# Patient Record
Sex: Female | Born: 1955 | Race: White | Hispanic: No | Marital: Married | Smoking: Former smoker
Health system: Southern US, Community
[De-identification: ages and names within clinical notes are randomized; demographics above are authoritative.]

## PROBLEM LIST (undated history)

## (undated) DIAGNOSIS — M1990 Unspecified osteoarthritis, unspecified site: Secondary | ICD-10-CM

## (undated) DIAGNOSIS — T4145XA Adverse effect of unspecified anesthetic, initial encounter: Secondary | ICD-10-CM

## (undated) DIAGNOSIS — T8859XA Other complications of anesthesia, initial encounter: Secondary | ICD-10-CM

## (undated) DIAGNOSIS — E039 Hypothyroidism, unspecified: Secondary | ICD-10-CM

## (undated) HISTORY — PX: COLONOSCOPY: SHX174

## (undated) HISTORY — PX: APPENDECTOMY: SHX54

## (undated) HISTORY — PX: ABDOMINAL HYSTERECTOMY: SHX81

---

## 2005-07-07 ENCOUNTER — Ambulatory Visit: Payer: Self-pay | Admitting: Unknown Physician Specialty

## 2006-11-10 ENCOUNTER — Ambulatory Visit: Payer: Self-pay | Admitting: Unknown Physician Specialty

## 2009-07-16 ENCOUNTER — Ambulatory Visit: Payer: Self-pay | Admitting: Unknown Physician Specialty

## 2009-07-28 ENCOUNTER — Ambulatory Visit: Payer: Self-pay | Admitting: Unknown Physician Specialty

## 2010-01-08 ENCOUNTER — Ambulatory Visit: Payer: Self-pay | Admitting: General Surgery

## 2010-07-29 ENCOUNTER — Ambulatory Visit: Payer: Self-pay | Admitting: Unknown Physician Specialty

## 2011-09-13 ENCOUNTER — Emergency Department: Payer: Self-pay | Admitting: Emergency Medicine

## 2011-09-15 ENCOUNTER — Ambulatory Visit: Payer: Self-pay | Admitting: Unknown Physician Specialty

## 2012-10-03 ENCOUNTER — Ambulatory Visit: Payer: Self-pay | Admitting: General Surgery

## 2017-05-31 ENCOUNTER — Other Ambulatory Visit: Payer: Self-pay | Admitting: Obstetrics & Gynecology

## 2017-05-31 DIAGNOSIS — Z1231 Encounter for screening mammogram for malignant neoplasm of breast: Secondary | ICD-10-CM

## 2017-06-09 ENCOUNTER — Ambulatory Visit
Admission: RE | Admit: 2017-06-09 | Discharge: 2017-06-09 | Disposition: A | Discharge status: Home / Self Care | Payer: BC Managed Care – PPO | Source: Ambulatory Visit | Attending: Obstetrics & Gynecology | Admitting: Obstetrics & Gynecology

## 2017-06-09 ENCOUNTER — Encounter: Payer: Self-pay | Admitting: Radiology

## 2017-06-09 DIAGNOSIS — Z1231 Encounter for screening mammogram for malignant neoplasm of breast: Secondary | ICD-10-CM | POA: Diagnosis present

## 2017-06-15 ENCOUNTER — Other Ambulatory Visit: Payer: Self-pay | Admitting: *Deleted

## 2017-06-15 ENCOUNTER — Inpatient Hospital Stay
Admission: RE | Admit: 2017-06-15 | Discharge: 2017-06-15 | Disposition: A | Discharge status: Home / Self Care | Payer: Self-pay | Source: Ambulatory Visit | Attending: *Deleted | Admitting: *Deleted

## 2017-06-15 DIAGNOSIS — Z9289 Personal history of other medical treatment: Secondary | ICD-10-CM

## 2017-06-22 ENCOUNTER — Other Ambulatory Visit: Payer: Self-pay | Admitting: Obstetrics & Gynecology

## 2017-06-22 DIAGNOSIS — R928 Other abnormal and inconclusive findings on diagnostic imaging of breast: Secondary | ICD-10-CM

## 2017-06-24 ENCOUNTER — Ambulatory Visit
Admission: RE | Admit: 2017-06-24 | Discharge: 2017-06-24 | Disposition: A | Discharge status: Home / Self Care | Payer: BC Managed Care – PPO | Source: Ambulatory Visit | Attending: Obstetrics & Gynecology | Admitting: Obstetrics & Gynecology

## 2017-06-24 DIAGNOSIS — R928 Other abnormal and inconclusive findings on diagnostic imaging of breast: Secondary | ICD-10-CM | POA: Diagnosis not present

## 2017-06-24 DIAGNOSIS — N6489 Other specified disorders of breast: Secondary | ICD-10-CM | POA: Insufficient documentation

## 2017-10-18 ENCOUNTER — Other Ambulatory Visit: Payer: Self-pay

## 2017-10-18 ENCOUNTER — Encounter: Payer: Self-pay | Admitting: *Deleted

## 2017-10-26 ENCOUNTER — Ambulatory Visit: Payer: Worker's Compensation | Admitting: Anesthesiology

## 2017-10-26 ENCOUNTER — Ambulatory Visit
Admission: RE | Admit: 2017-10-26 | Discharge: 2017-10-26 | Disposition: A | Discharge status: Home / Self Care | Payer: Worker's Compensation | Source: Ambulatory Visit | Attending: Surgery | Admitting: Surgery

## 2017-10-26 ENCOUNTER — Encounter
Admission: RE | Disposition: A | Discharge status: Home / Self Care | Payer: Self-pay | Source: Ambulatory Visit | Attending: Surgery

## 2017-10-26 DIAGNOSIS — X58XXXA Exposure to other specified factors, initial encounter: Secondary | ICD-10-CM | POA: Diagnosis not present

## 2017-10-26 DIAGNOSIS — Z87891 Personal history of nicotine dependence: Secondary | ICD-10-CM | POA: Diagnosis not present

## 2017-10-26 DIAGNOSIS — S83231A Complex tear of medial meniscus, current injury, right knee, initial encounter: Secondary | ICD-10-CM | POA: Diagnosis present

## 2017-10-26 DIAGNOSIS — M1711 Unilateral primary osteoarthritis, right knee: Secondary | ICD-10-CM | POA: Insufficient documentation

## 2017-10-26 DIAGNOSIS — M94261 Chondromalacia, right knee: Secondary | ICD-10-CM | POA: Insufficient documentation

## 2017-10-26 HISTORY — DX: Hypothyroidism, unspecified: E03.9

## 2017-10-26 HISTORY — DX: Other complications of anesthesia, initial encounter: T88.59XA

## 2017-10-26 HISTORY — PX: KNEE ARTHROSCOPY: SHX127

## 2017-10-26 HISTORY — DX: Adverse effect of unspecified anesthetic, initial encounter: T41.45XA

## 2017-10-26 HISTORY — DX: Unspecified osteoarthritis, unspecified site: M19.90

## 2017-10-26 SURGERY — ARTHROSCOPY, KNEE
Anesthesia: General | Site: Knee | Laterality: Right | Wound class: Clean

## 2017-10-26 MED ORDER — HYDROCODONE-ACETAMINOPHEN 5-325 MG PO TABS: 1.0000 | ORAL_TABLET | Freq: Four times a day (QID) | ORAL | 0 refills | Status: DC | PRN

## 2017-10-26 MED ORDER — ONDANSETRON HCL 4 MG/2ML IJ SOLN: 4.0000 mg | Freq: Once | INTRAMUSCULAR | Status: DC | PRN

## 2017-10-26 MED ORDER — ONDANSETRON HCL 4 MG/2ML IJ SOLN: 4.0000 mg | Freq: Four times a day (QID) | INTRAMUSCULAR | Status: DC | PRN

## 2017-10-26 MED ORDER — OXYCODONE HCL 5 MG PO TABS: 5.0000 mg | ORAL_TABLET | Freq: Once | ORAL | Status: DC | PRN

## 2017-10-26 MED ORDER — ACETAMINOPHEN 325 MG PO TABS: 325.0000 mg | ORAL_TABLET | ORAL | Status: DC | PRN

## 2017-10-26 MED ORDER — HYDROCODONE-ACETAMINOPHEN 5-325 MG PO TABS: 1.0000 | ORAL_TABLET | ORAL | Status: DC | PRN

## 2017-10-26 MED ORDER — FENTANYL CITRATE (PF) 100 MCG/2ML IJ SOLN: 25.0000 ug | INTRAMUSCULAR | Status: DC | PRN

## 2017-10-26 MED ORDER — GLYCOPYRROLATE 0.2 MG/ML IJ SOLN
INTRAMUSCULAR | Status: DC | PRN
  Administered 2017-10-26: 0.1 mg via INTRAVENOUS

## 2017-10-26 MED ORDER — FENTANYL CITRATE (PF) 100 MCG/2ML IJ SOLN
INTRAMUSCULAR | Status: DC | PRN
  Administered 2017-10-26: 12.5 ug via INTRAVENOUS
  Administered 2017-10-26: 50 ug via INTRAVENOUS
  Administered 2017-10-26: 12.5 ug via INTRAVENOUS

## 2017-10-26 MED ORDER — PROPOFOL 10 MG/ML IV BOLUS
INTRAVENOUS | Status: DC | PRN
  Administered 2017-10-26: 140 mg via INTRAVENOUS

## 2017-10-26 MED ORDER — ONDANSETRON HCL 4 MG/2ML IJ SOLN
INTRAMUSCULAR | Status: DC | PRN
  Administered 2017-10-26: 4 mg via INTRAVENOUS

## 2017-10-26 MED ORDER — METOCLOPRAMIDE HCL 5 MG/ML IJ SOLN: 5.0000 mg | Freq: Three times a day (TID) | INTRAMUSCULAR | Status: DC | PRN

## 2017-10-26 MED ORDER — LACTATED RINGERS IV SOLN: 500.0000 mL | INTRAVENOUS | Status: DC

## 2017-10-26 MED ORDER — METOCLOPRAMIDE HCL 5 MG PO TABS: 5.0000 mg | ORAL_TABLET | Freq: Three times a day (TID) | ORAL | Status: DC | PRN

## 2017-10-26 MED ORDER — DEXAMETHASONE SODIUM PHOSPHATE 4 MG/ML IJ SOLN
INTRAMUSCULAR | Status: DC | PRN
  Administered 2017-10-26: 4 mg via INTRAVENOUS

## 2017-10-26 MED ORDER — LIDOCAINE HCL (CARDIAC) 20 MG/ML IV SOLN
INTRAVENOUS | Status: DC | PRN
  Administered 2017-10-26: 40 mg via INTRATRACHEAL

## 2017-10-26 MED ORDER — POTASSIUM CHLORIDE IN NACL 20-0.9 MEQ/L-% IV SOLN: INTRAVENOUS | Status: DC

## 2017-10-26 MED ORDER — LACTATED RINGERS IV SOLN
INTRAVENOUS | Status: DC
  Administered 2017-10-26: 11:00:00 via INTRAVENOUS

## 2017-10-26 MED ORDER — ONDANSETRON HCL 4 MG PO TABS: 4.0000 mg | ORAL_TABLET | Freq: Four times a day (QID) | ORAL | Status: DC | PRN

## 2017-10-26 MED ORDER — CEFAZOLIN SODIUM-DEXTROSE 2-4 GM/100ML-% IV SOLN
2.0000 g | Freq: Once | INTRAVENOUS | Status: AC
  Administered 2017-10-26: 2 g via INTRAVENOUS

## 2017-10-26 MED ORDER — MIDAZOLAM HCL 5 MG/5ML IJ SOLN
INTRAMUSCULAR | Status: DC | PRN
  Administered 2017-10-26: 2 mg via INTRAVENOUS

## 2017-10-26 MED ORDER — LIDOCAINE HCL (PF) 1 % IJ SOLN
INTRAMUSCULAR | Status: DC | PRN
  Administered 2017-10-26: 20 mL

## 2017-10-26 MED ORDER — LIDOCAINE-EPINEPHRINE 1 %-1:100000 IJ SOLN
INTRAMUSCULAR | Status: DC | PRN
  Administered 2017-10-26: 30 mL

## 2017-10-26 MED ORDER — OXYCODONE HCL 5 MG/5ML PO SOLN: 5.0000 mg | Freq: Once | ORAL | Status: DC | PRN

## 2017-10-26 MED ORDER — ACETAMINOPHEN 160 MG/5ML PO SOLN: 325.0000 mg | ORAL | Status: DC | PRN

## 2017-10-26 MED ORDER — BUPIVACAINE HCL (PF) 0.5 % IJ SOLN
INTRAMUSCULAR | Status: DC | PRN
  Administered 2017-10-26: 30 mL

## 2017-10-26 SURGICAL SUPPLY — 30 items
BANDAGE ELASTIC 6 LF NS (GAUZE/BANDAGES/DRESSINGS) ×3 IMPLANT
BLADE FULL RADIUS 3.5 (BLADE) ×3 IMPLANT
BUR ACROMIONIZER 4.0 (BURR) IMPLANT
CHLORAPREP W/TINT 26ML (MISCELLANEOUS) ×3 IMPLANT
COVER LIGHT HANDLE UNIVERSAL (MISCELLANEOUS) ×6 IMPLANT
CUFF TOURN SGL QUICK 30 (MISCELLANEOUS) ×2
CUFF TOURN SGL QUICK 34 (TOURNIQUET CUFF)
CUFF TRNQT CYL 34X4X40X1 (TOURNIQUET CUFF) IMPLANT
CUFF TRNQT CYL LO 30X4X (MISCELLANEOUS) ×1 IMPLANT
DRAPE IMP U-DRAPE 54X76 (DRAPES) ×3 IMPLANT
GAUZE SPONGE 4X4 12PLY STRL (GAUZE/BANDAGES/DRESSINGS) ×3 IMPLANT
GLOVE BIO SURGEON STRL SZ8 (GLOVE) ×9 IMPLANT
GLOVE INDICATOR 8.0 STRL GRN (GLOVE) ×6 IMPLANT
GOWN STRL REUS W/ TWL LRG LVL3 (GOWN DISPOSABLE) ×1 IMPLANT
GOWN STRL REUS W/ TWL XL LVL3 (GOWN DISPOSABLE) ×1 IMPLANT
GOWN STRL REUS W/TWL LRG LVL3 (GOWN DISPOSABLE) ×2
GOWN STRL REUS W/TWL XL LVL3 (GOWN DISPOSABLE) ×2
IV LACTATED RINGER IRRG 3000ML (IV SOLUTION) ×4
IV LR IRRIG 3000ML ARTHROMATIC (IV SOLUTION) ×2 IMPLANT
KIT RM TURNOVER STRD PROC AR (KITS) ×3 IMPLANT
MANIFOLD 4PT FOR NEPTUNE1 (MISCELLANEOUS) ×3 IMPLANT
NEEDLE HYPO 21X1.5 SAFETY (NEEDLE) ×6 IMPLANT
PACK ARTHROSCOPY KNEE (MISCELLANEOUS) ×3 IMPLANT
STRAP BODY AND KNEE 60X3 (MISCELLANEOUS) ×3 IMPLANT
SUT PROLENE 4 0 PS 2 18 (SUTURE) ×3 IMPLANT
SUT VIC AB 2-0 CT1 27 (SUTURE)
SUT VIC AB 2-0 CT1 TAPERPNT 27 (SUTURE) IMPLANT
SYR 50ML LL SCALE MARK (SYRINGE) ×3 IMPLANT
TUBING ARTHRO INFLOW-ONLY STRL (TUBING) ×3 IMPLANT
WAND HAND CNTRL MULTIVAC 90 (MISCELLANEOUS) IMPLANT

## 2017-10-26 NOTE — Anesthesia Preprocedure Evaluation (Signed)
Anesthesia Evaluation  Patient identified by MRN, date of birth, ID band Patient awake    Reviewed: Allergy & Precautions, H&P , NPO status , Patient's Chart, lab work & pertinent test results, reviewed documented beta blocker date and time   History of Anesthesia Complications (+) PONV and history of anesthetic complications  Airway Mallampati: II  TM Distance: >3 FB Neck ROM: full    Dental no notable dental hx.    Pulmonary neg pulmonary ROS, former smoker,    Pulmonary exam normal breath sounds clear to auscultation       Cardiovascular negative cardio ROS   Rhythm:regular Rate:Normal     Neuro/Psych negative neurological ROS  negative psych ROS   GI/Hepatic negative GI ROS, Neg liver ROS,   Endo/Other  Hypothyroidism   Renal/GU negative Renal ROS  negative genitourinary   Musculoskeletal   Abdominal   Peds  Hematology negative hematology ROS (+)   Anesthesia Other Findings   Reproductive/Obstetrics negative OB ROS                             Anesthesia Physical Anesthesia Plan  ASA: II  Anesthesia Plan: General   Post-op Pain Management:    Induction:   PONV Risk Score and Plan: 4 or greater and Ondansetron, Dexamethasone and Midazolam  Airway Management Planned:   Additional Equipment:   Intra-op Plan:   Post-operative Plan:   Informed Consent: I have reviewed the patients History and Physical, chart, labs and discussed the procedure including the risks, benefits and alternatives for the proposed anesthesia with the patient or authorized representative who has indicated his/her understanding and acceptance.     Plan Discussed with: CRNA  Anesthesia Plan Comments:         Anesthesia Quick Evaluation

## 2017-10-26 NOTE — Anesthesia Procedure Notes (Signed)
Procedure Name: LMA Insertion Date/Time: 10/26/2017 12:02 PM Performed by: Jimmy PicketAmyot, Leea Rambeau, CRNA Pre-anesthesia Checklist: Patient identified, Emergency Drugs available, Suction available, Timeout performed and Patient being monitored Patient Re-evaluated:Patient Re-evaluated prior to induction Oxygen Delivery Method: Circle system utilized Preoxygenation: Pre-oxygenation with 100% oxygen Induction Type: IV induction LMA: LMA inserted LMA Size: 4.0 Number of attempts: 1 Placement Confirmation: positive ETCO2 and breath sounds checked- equal and bilateral Tube secured with: Tape

## 2017-10-26 NOTE — Op Note (Signed)
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  Postoperative diagnosis:   Same.  Procedure:   Arthroscopic partial medial meniscectomy, arthroscopic abrasion chondroplasty with microfracturing of focal lateral femoral condylar lesion, arthroscopic abrasion chondroplasty of grade 2-3 chondromalacial changes of medial femoral condyle, lateral tibial plateau, and femoral trochlea, right knee.  Surgeon:   Maryagnes Amos, M.D.  Anesthesia:   General LMA.  Findings:   As above.  There was a complex degenerative tear involving the posteromedial portion of the medial meniscus extending posteriorly. Laterally, the meniscus was in satisfactory condition.  There were grade 2-3 chondromalacial changes diffusely involving the medial femoral condyle, grade 2 chondromalacial changes involving the medial tibial plateau, and focal grade 4 chondromalacial changes involving the weightbearing portion of the lateral femoral condyle measuring approximately 1.5 x 1.5 cm, as well as a smaller area involving the weightbearing portion of the lateral tibial plateau. There also were diffuse grade 3 chondromalacial changes involving the femoral trochlea, and grade 2 chondromalacial changes involving the patella.  Complications:   None.  EBL:   3 cc.  Total fluids:   800 cc of crystalloid.  Tourniquet time:   None  Drains:   None  Closure:   4-0 Prolene interrupted sutures.  Brief clinical note:   The patient is a 62 year old female with a several month history of medial sided right knee pain following a work-related injury. Subsequent workup, including an MRI scan, demonstrated the presence of a medial meniscus tear, as well as underlying degenerative joint disease. The patient presents at this time for arthroscopy, debridement, and partial medial meniscectomy.  Procedure:   The patient was brought into the  operating room and lain in the supine position. After adequate general laryngeal mask anesthesia was obtained, a timeout was performed to verify the appropriate side. The patient's right knee was injected sterilely using a solution of 30 cc of 1% lidocaine and 30 cc of 0.5% Sensorcaine with epinephrine. The right lower extremity was prepped with ChloraPrep solution before being draped sterilely. Preoperative antibiotics were administered. The expected portal sites were injected with 0.5% Sensorcaine with epinephrine before the camera was placed in the anterolateral portal and instrumentation performed through the anteromedial portal. The knee was sequentially examined beginning in the suprapatellar pouch, then progressing to the patellofemoral space, the medial gutter compartment, the notch, and finally the lateral compartment and gutter. The findings were as described above. Abundant reactive synovial tissues anteriorly were debrided using the full-radius resector in order to improve visualization. The complex tear of the medial meniscus was debrided back to stable margins using baskets and the full-radius resector. Subsequent probing of the remaining rim demonstrated excellent stability.  Areas of grade 2 and grade III chondromalacia involving the medial femoral condyle, the medial tibial plateau, and the femoral trochlea all were debrided back to stable margins using the full-radius resector.    Laterally, the lateral meniscus was probed and found to be intact. However, there was an area of full-thickness articular cartilage loss involving the weightbearing portion of the lateral femoral condyle which was debrided back to stable margins using the full-radius resector. Given that the area of subchondral bone measured approximately 1.5 x 1.5 cm, it was felt best to proceed with a microfracturing of this area. This was done with the appropriate arthroscopic awls. The inflow was temporarily turned off to verify  that appropriate bleeding from  the microfracture sites was present.  A small area of loose articular cartilage also was debrided from the lateral tibial plateau. The instruments were removed from the joint after suctioning the excess fluid. The portal sites were closed using 4-0 Prolene interrupted sutures before a sterile bulky dressing was applied to the knee. The patient was then awakened, extubated, and returned to the recovery room in satisfactory condition after tolerating the procedure well.

## 2017-10-26 NOTE — H&P (Signed)
Paper H&P to be scanned into permanent record. H&P reviewed and patient re-examined. No changes. 

## 2017-10-26 NOTE — Transfer of Care (Signed)
Immediate Anesthesia Transfer of Care Note  Patient: Molly Lawrence  Procedure(s) Performed: ARTHROSCOPY KNEE WITH DEBRIDEMENT AND PARTIAL MEDIAL MENISCECTOMY (Right Knee)  Patient Location: PACU  Anesthesia Type: General  Level of Consciousness: awake, alert  and patient cooperative  Airway and Oxygen Therapy: Patient Spontanous Breathing and Patient connected to supplemental oxygen  Post-op Assessment: Post-op Vital signs reviewed, Patient's Cardiovascular Status Stable, Respiratory Function Stable, Patent Airway and No signs of Nausea or vomiting  Post-op Vital Signs: Reviewed and stable  Complications: No apparent anesthesia complications

## 2017-10-26 NOTE — Discharge Instructions (Signed)
General Anesthesia, Adult, Care After °These instructions provide you with information about caring for yourself after your procedure. Your health care provider may also give you more specific instructions. Your treatment has been planned according to current medical practices, but problems sometimes occur. Call your health care provider if you have any problems or questions after your procedure. °What can I expect after the procedure? °After the procedure, it is common to have: °· Vomiting. °· A sore throat. °· Mental slowness. ° °It is common to feel: °· Nauseous. °· Cold or shivery. °· Sleepy. °· Tired. °· Sore or achy, even in parts of your body where you did not have surgery. ° °Follow these instructions at home: °For at least 24 hours after the procedure: °· Do not: °? Participate in activities where you could fall or become injured. °? Drive. °? Use heavy machinery. °? Drink alcohol. °? Take sleeping pills or medicines that cause drowsiness. °? Make important decisions or sign legal documents. °? Take care of children on your own. °· Rest. °Eating and drinking °· If you vomit, drink water, juice, or soup when you can drink without vomiting. °· Drink enough fluid to keep your urine clear or pale yellow. °· Make sure you have little or no nausea before eating solid foods. °· Follow the diet recommended by your health care provider. °General instructions °· Have a responsible adult stay with you until you are awake and alert. °· Return to your normal activities as told by your health care provider. Ask your health care provider what activities are safe for you. °· Take over-the-counter and prescription medicines only as told by your health care provider. °· If you smoke, do not smoke without supervision. °· Keep all follow-up visits as told by your health care provider. This is important. °Contact a health care provider if: °· You continue to have nausea or vomiting at home, and medicines are not helpful. °· You  cannot drink fluids or start eating again. °· You cannot urinate after 8-12 hours. °· You develop a skin rash. °· You have fever. °· You have increasing redness at the site of your procedure. °Get help right away if: °· You have difficulty breathing. °· You have chest pain. °· You have unexpected bleeding. °· You feel that you are having a life-threatening or urgent problem. °This information is not intended to replace advice given to you by your health care provider. Make sure you discuss any questions you have with your health care provider. °Document Released: 12/27/2000 Document Revised: 02/23/2016 Document Reviewed: 09/04/2015 °Elsevier Interactive Patient Education © 2018 [employer] ° ° °Keep dressing dry and intact.  °May shower after dressing changed on post-op day #4 (Sunday).  °Cover sutures with Band-Aids after drying off. °Apply ice frequently to knee. °Take ibuprofen 600-800 mg TID with meals for 7-10 days, then as necessary. °Take pain medication as prescribed or ES Tylenol when needed.  °May weight-bear as tolerated - use crutches or walker as needed. °Follow-up in 10-14 days or as scheduled. °

## 2017-10-26 NOTE — Anesthesia Postprocedure Evaluation (Signed)
Anesthesia Post Note  Patient: Molly Lawrence  Procedure(s) Performed: ARTHROSCOPY KNEE WITH DEBRIDEMENT AND PARTIAL MEDIAL MENISCECTOMY (Right Knee)  Patient location during evaluation: PACU Anesthesia Type: General Level of consciousness: awake and alert Pain management: pain level controlled Vital Signs Assessment: post-procedure vital signs reviewed and stable Respiratory status: spontaneous breathing, nonlabored ventilation and respiratory function stable Cardiovascular status: blood pressure returned to baseline and stable Postop Assessment: no apparent nausea or vomiting Anesthetic complications: no    Kieana Livesay D Monae Topping

## 2018-06-01 ENCOUNTER — Other Ambulatory Visit: Payer: Self-pay | Admitting: Obstetrics & Gynecology

## 2018-06-01 DIAGNOSIS — Z1231 Encounter for screening mammogram for malignant neoplasm of breast: Secondary | ICD-10-CM

## 2018-06-14 ENCOUNTER — Ambulatory Visit
Admission: RE | Admit: 2018-06-14 | Discharge: 2018-06-14 | Disposition: A | Discharge status: Home / Self Care | Payer: BC Managed Care – PPO | Source: Ambulatory Visit | Attending: Obstetrics & Gynecology | Admitting: Obstetrics & Gynecology

## 2018-06-14 DIAGNOSIS — Z1231 Encounter for screening mammogram for malignant neoplasm of breast: Secondary | ICD-10-CM | POA: Diagnosis present

## 2019-05-03 ENCOUNTER — Other Ambulatory Visit: Payer: Self-pay | Admitting: Internal Medicine

## 2019-05-03 DIAGNOSIS — Z1231 Encounter for screening mammogram for malignant neoplasm of breast: Secondary | ICD-10-CM

## 2019-06-05 ENCOUNTER — Ambulatory Visit: Payer: Self-pay | Admitting: Obstetrics and Gynecology

## 2019-06-07 ENCOUNTER — Other Ambulatory Visit: Payer: Self-pay

## 2019-06-07 DIAGNOSIS — Z20822 Contact with and (suspected) exposure to covid-19: Secondary | ICD-10-CM

## 2019-06-08 LAB — NOVEL CORONAVIRUS, NAA: SARS-CoV-2, NAA: NOT DETECTED

## 2019-06-18 ENCOUNTER — Ambulatory Visit
Admission: RE | Admit: 2019-06-18 | Discharge: 2019-06-18 | Disposition: A | Discharge status: Home / Self Care | Payer: BC Managed Care – PPO | Source: Ambulatory Visit | Attending: Internal Medicine | Admitting: Internal Medicine

## 2019-06-18 DIAGNOSIS — Z1231 Encounter for screening mammogram for malignant neoplasm of breast: Secondary | ICD-10-CM | POA: Diagnosis not present

## 2019-06-25 ENCOUNTER — Other Ambulatory Visit: Payer: Self-pay

## 2019-06-25 ENCOUNTER — Ambulatory Visit (INDEPENDENT_AMBULATORY_CARE_PROVIDER_SITE_OTHER): Payer: BC Managed Care – PPO | Admitting: Obstetrics and Gynecology

## 2019-06-25 ENCOUNTER — Encounter: Payer: Self-pay | Admitting: Obstetrics and Gynecology

## 2019-06-25 VITALS — BP 112/82 | HR 74 | Ht 69.0 in | Wt 200.0 lb

## 2019-06-25 DIAGNOSIS — Z01419 Encounter for gynecological examination (general) (routine) without abnormal findings: Secondary | ICD-10-CM | POA: Diagnosis not present

## 2019-06-25 DIAGNOSIS — Z1239 Encounter for other screening for malignant neoplasm of breast: Secondary | ICD-10-CM

## 2019-06-25 NOTE — Progress Notes (Signed)
Gynecology Annual Exam  PCP: Marguarite Arbour, MD  Chief Complaint:  Chief Complaint  Patient presents with  . Gynecologic Exam    History of Present Illness:Patient is a 63 y.o. G3P3 presents for annual exam. The patient has no complaints today.   LMP: No LMP recorded. Patient has had a hysterectomy. No postmenopausal bleeding  The patient is sexually active. She denies dyspareunia.  The patient does perform self breast exams.  There is no notable family history of breast or ovarian cancer in her family.  The patient wears seatbelts: yes.   The patient has regular exercise: not asked.    The patient denies current symptoms of depression.     Review of Systems: Review of Systems  Constitutional: Negative for chills and fever.  HENT: Negative for congestion.   Respiratory: Negative for cough and shortness of breath.   Cardiovascular: Negative for chest pain and palpitations.  Gastrointestinal: Negative for abdominal pain, constipation, diarrhea, heartburn, nausea and vomiting.  Genitourinary: Negative for dysuria, frequency and urgency.  Skin: Negative for itching and rash.  Neurological: Negative for dizziness and headaches.  Endo/Heme/Allergies: Negative for polydipsia.  Psychiatric/Behavioral: Negative for depression.    Past Medical History:  Past Medical History:  Diagnosis Date  . Arthritis    hands, knees, feet  . Complication of anesthesia    low BP after hysterectomy  . Hypothyroidism     Past Surgical History:  Past Surgical History:  Procedure Laterality Date  . ABDOMINAL HYSTERECTOMY    . APPENDECTOMY    . COLONOSCOPY    . KNEE ARTHROSCOPY Right 10/26/2017   Procedure: Arthroscopic partial medial meniscectomy, arthroscopic abrasion chondroplasty with microfracturing of focal lateral femoral condylar lesion, arthroscopic abrasion chondroplasty of grade 2-3 chondromalacial changes of medial femoral condyle, lateral tibial plateau, and femoral  trochlea, right knee.;  Surgeon: Christena Flake, MD;  Location: [facility];  Service: Orthopedics;  Later    Gynecologic History:  No LMP recorded. Patient has had a hysterectomy. Last Pap: Results were: N/A status post prior hysterectomy Last mammogram: 06/18/2019 Results were: BI-RAD I  Obstetric History: G3P3  Family History:  Family History  Problem Relation Age of Onset  . Breast cancer Neg Hx     Social History:  Social History   Socioeconomic History  . Marital status: Married    Spouse name: Not on file  . Number of children: Not on file  . Years of education: Not on file  . Highest education level: Not on file  Occupational History  . Not on file  Social Needs  . Financial resource strain: Not on file  . Food insecurity    Worry: Not on file    Inability: Not on file  . Transportation needs    Medical: Not on file    Non-medical: Not on file  Tobacco Use  . Smoking status: Former Smoker    Quit date: 1991    Years since quitting: 29.7  . Smokeless tobacco: Never Used  Substance and Sexual Activity  . Alcohol use: Yes    Alcohol/week: 5.0 standard drinks    Types: 5 Glasses of wine per week  . Drug use: Never  . Sexual activity: Yes    Birth control/protection: Surgical  Lifestyle  . Physical activity    Days per week: Not on file    Minutes per session: Not on file  . Stress: Not on file  Relationships  . Social connections  Talks on phone: Not on file    Gets together: Not on file    Attends religious service: Not on file    Active member of club or organization: Not on file    Attends meetings of clubs or organizations: Not on file    Relationship status: Not on file  . Intimate partner violence    Fear of current or ex partner: Not on file    Emotionally abused: Not on file    Physically abused: Not on file    Forced sexual activity: Not on file  Other Topics Concern  . Not on file  Social History Narrative  . Not on file     Allergies:  Allergies  Allergen Reactions  . Pineapple Swelling    tongue    Medications: Prior to Admission medications   Medication Sig Start Date End Date Taking? Authorizing Provider  diphenhydrAMINE (BENADRYL) 25 MG tablet Take 25 mg by mouth at bedtime as needed.   Yes [provider]  levothyroxine (SYNTHROID) 125 MCG tablet Take 125 mcg by mouth daily before breakfast.   Yes [provider]  Multiple Vitamin (MULTIVITAMIN) tablet Take 1 tablet by mouth daily.   Yes [provider]    Physical Exam Vitals: Blood pressure 112/82, pulse 74, height 5\' 9"  (1.753 m), weight 200 lb (90.7 kg).  General: NAD HEENT: normocephalic, anicteric Thyroid: no enlargement, no palpable nodules Pulmonary: No increased work of breathing, CTAB Cardiovascular: RRR, distal pulses 2+ Breast: Breast symmetrical, no tenderness, no palpable nodules or masses, no skin or nipple retraction present, no nipple discharge.  No axillary or supraclavicular lymphadenopathy. Abdomen: NABS, soft, non-tender, non-distended.  Umbilicus without lesions.  No hepatomegaly, splenomegaly or masses palpable. No evidence of hernia  Genitourinary:  External: Normal external female genitalia.  Normal urethral meatus, normal Bartholin's and Skene's glands.    Vagina: Normal vaginal mucosa, no evidence of prolapse.   Normal vaginal cuff  Cervix:  surgically absent  Uterus: surgically absent  Adnexa: ovaries non-enlarged, no adnexal masses  Rectal: deferred  Lymphatic: no evidence of inguinal lymphadenopathy Extremities: no edema, erythema, or tenderness Neurologic: Grossly intact Psychiatric: mood appropriate, affect full  Female chaperone present for pelvic and breast  portions of the physical exam     Assessment: 63 y.o. G3P3 routine annual exam  Plan: Problem List Items Addressed This Visit    None    Visit Diagnoses    Encounter for gynecological examination without abnormal  finding    -  Primary   Breast screening          1) Mammogram - recommend yearly screening mammogram.  Mammogram Is up to date  2) STI screening  was notoffered and therefore not obtained  3) ASCCP guidelines and rational discussed.  Patient opts for discontinue secondary to prior hysterectomy screening interval  4) Osteoporosis  - per USPTF routine screening DEXA at age 62 - Baseline DEXA 07/29/2010 normal - mother pelvic fracture  Consider FDA-approved medical therapies in postmenopausal women and men aged 62 years and older, based on the following: a) A hip or vertebral (clinical or morphometric) fracture b) T-score ? -2.5 at the femoral neck or spine after appropriate evaluation to exclude secondary causes C) Low bone mass (T-score between -1.0 and -2.5 at the femoral neck or spine) and a 10-year probability of a hip fracture ? 3% or a 10-year probability of a major osteoporosis-related fracture ? 20% based on the [place]-adapted WHO algorithm   5) Routine  healthcare maintenance including cholesterol, diabetes screening discussed managed by PCP  6) Colonoscopy - next due at 66  7) Return in about 1 year (around 06/24/2020) for annual.    Malachy Mood, MD Mosetta Pigeon, [facility] 06/25/2019, 10:41 AM

## 2019-09-03 ENCOUNTER — Other Ambulatory Visit: Payer: Self-pay

## 2019-09-03 DIAGNOSIS — Z20822 Contact with and (suspected) exposure to covid-19: Secondary | ICD-10-CM

## 2019-09-05 LAB — NOVEL CORONAVIRUS, NAA: SARS-CoV-2, NAA: NOT DETECTED

## 2019-09-20 ENCOUNTER — Emergency Department
Admission: EM | Admit: 2019-09-20 | Discharge: 2019-09-21 | Disposition: A | Discharge status: Home / Self Care | Payer: BC Managed Care – PPO | Attending: Emergency Medicine | Admitting: Emergency Medicine

## 2019-09-20 ENCOUNTER — Other Ambulatory Visit: Payer: Self-pay

## 2019-09-20 DIAGNOSIS — E039 Hypothyroidism, unspecified: Secondary | ICD-10-CM | POA: Diagnosis not present

## 2019-09-20 DIAGNOSIS — R600 Localized edema: Secondary | ICD-10-CM | POA: Diagnosis present

## 2019-09-20 DIAGNOSIS — K047 Periapical abscess without sinus: Secondary | ICD-10-CM

## 2019-09-20 DIAGNOSIS — L03211 Cellulitis of face: Secondary | ICD-10-CM | POA: Insufficient documentation

## 2019-09-20 DIAGNOSIS — Z87891 Personal history of nicotine dependence: Secondary | ICD-10-CM | POA: Diagnosis not present

## 2019-09-20 LAB — CBC
HCT: 36.2 % (ref 36.0–46.0)
Hemoglobin: 12.7 g/dL (ref 12.0–15.0)
MCH: 29.7 pg (ref 26.0–34.0)
MCHC: 35.1 g/dL (ref 30.0–36.0)
MCV: 84.8 fL (ref 80.0–100.0)
Platelets: 200 10*3/uL (ref 150–400)
RBC: 4.27 MIL/uL (ref 3.87–5.11)
RDW: 12.3 % (ref 11.5–15.5)
WBC: 9.1 10*3/uL (ref 4.0–10.5)
nRBC: 0 % (ref 0.0–0.2)

## 2019-09-20 MED ORDER — AMPICILLIN-SULBACTAM SODIUM 3 (2-1) G IJ SOLR
3.0000 g | Freq: Once | INTRAMUSCULAR | Status: AC
  Administered 2019-09-21: 3 g via INTRAVENOUS
  Filled 2019-09-20: qty 8

## 2019-09-20 NOTE — ED Triage Notes (Signed)
Pt to the er for right sided facial swelling following a root canal on Monday. Pt currently taking amoxicillin. Swelling did not start until Tuesday night. Fever started tonight. Pt called dentist who advised IV antibiotics.

## 2019-09-20 NOTE — ED Provider Notes (Signed)
[facility] Emergency Department Provider Note ______________________   First MD Initiated Contact with Patient 09/20/19 2326     (approximate)  I have reviewed the triage vital signs and the nursing notes.   HISTORY  Chief Complaint Facial Swelling   HPI Molly Lawrence is a 63 y.o. female with low list of previous medical conditions including recent mandibular root canal performed Monday presents to the emergency department secondary to right facial swelling tenderness.  Patient also admits to fever which started tonight.  Patient states that she is currently taking amoxicillin and has been doing so since her root canal.  Patient states that she spoke with her orthodontist who advised her to come to the emergency department and have IV antibiotics and follow-up with him in clinic tomorrow.  Patient denies any difficulty breathing or swallowing.       Past Medical History:  Diagnosis Date  . Arthritis    hands, knees, feet  . Complication of anesthesia    low BP after hysterectomy  . Hypothyroidism     There are no problems to display for this patient.   Past Surgical History:  Procedure Laterality Date  . ABDOMINAL HYSTERECTOMY    . APPENDECTOMY    . COLONOSCOPY    . KNEE ARTHROSCOPY Right 10/26/2017   Procedure: Arthroscopic partial medial meniscectomy, arthroscopic abrasion chondroplasty with microfracturing of focal lateral femoral condylar lesion, arthroscopic abrasion chondroplasty of grade 2-3 chondromalacial changes of medial femoral condyle, lateral tibial plateau, and femoral trochlea, right knee.;  Surgeon: Christena Flake, MD;  Location: [facility];  Service: Orthopedics;  Later    Prior to Admission medications   Medication Sig Start Date End Date Taking? Authorizing Provider  amoxicillin (AMOXIL) 500 MG capsule Take 500 mg by mouth 3 (three) times daily. 09/19/19  Yes [provider]  diphenhydrAMINE (BENADRYL) 25  MG tablet Take 25 mg by mouth at bedtime as needed.   Yes [provider]  levothyroxine (SYNTHROID) 125 MCG tablet Take 125 mcg by mouth daily before breakfast.   Yes [provider]  Multiple Vitamin (MULTIVITAMIN) tablet Take 1 tablet by mouth daily.   Yes [provider]  omeprazole (PRILOSEC) 40 MG capsule Take 40 mg by mouth daily. 08/06/19  Yes [provider]  clindamycin (CLEOCIN) 300 MG capsule Take 1 capsule (300 mg total) by mouth 3 (three) times daily for 10 days. 09/21/19 10/01/19  Darci Current, MD    Allergies Pineapple  Family History  Problem Relation Age of Onset  . Breast cancer Neg Hx     Social History Social History   Tobacco Use  . Smoking status: Former Smoker    Quit date: 1991    Years since quitting: 29.9  . Smokeless tobacco: Never Used  Substance Use Topics  . Alcohol use: Yes    Alcohol/week: 5.0 standard drinks    Types: 5 Glasses of wine per week  . Drug use: Never    Review of Systems Constitutional: No fever/chills Eyes: No visual changes. ENT: No sore throat.  Positive for right jaw swelling Cardiovascular: Denies chest pain. Respiratory: Denies shortness of breath. Gastrointestinal: No abdominal pain.  No nausea, no vomiting.  No diarrhea.  No constipation. Genitourinary: Negative for dysuria. Musculoskeletal: Negative for neck pain.  Negative for back pain. Integumentary: Negative for rash. Neurological: Negative for headaches, focal weakness or numbness.  ____________________________________________   PHYSICAL EXAM:  VITAL SIGNS: ED Triage Vitals  Enc Vitals Group  BP 09/20/19 2036 (!) 165/63     Pulse Rate 09/20/19 2036 95     Resp 09/20/19 2036 18     Temp 09/20/19 2036 99.2 F (37.3 C)     Temp Source 09/20/19 2036 Oral     SpO2 09/20/19 2036 97 %     Weight 09/20/19 2037 90.7 kg (200 lb)     Height 09/20/19 2037 1.753 m (5\' 9" )     Head Circumference --      Peak Flow --        Pain Score 09/20/19 2036 6     Pain Loc --      Pain Edu? --      Excl. in GC? --     Constitutional: Alert and oriented.  Eyes: Conjunctivae are normal.  Head: Right facial swelling along the anterior portion of the right side of the face along the mandible Mouth/Throat: No gross abnormality noted within the mouth Neck: No stridor.  No meningeal signs.   Cardiovascular: Normal rate, regular rhythm. Good peripheral circulation. Grossly normal heart sounds. Respiratory: Normal respiratory effort.  No retractions. Gastrointestinal: Soft and nontender. No distention.   Musculoskeletal: No lower extremity tenderness nor edema. No gross deformities of extremities. Neurologic:  Normal speech and language. No gross focal neurologic deficits are appreciated.  Skin: Right facial swelling without any overlying erythema Psychiatric: Mood and affect are normal. Speech and behavior are normal.  ____________________________________________   LABS (all labs ordered are listed, but only abnormal results are displayed)  Labs Reviewed  BASIC METABOLIC PANEL - Abnormal; Notable for the following components:      Result Value   Glucose, Bld 117 (*)    All other components within normal limits  CBC   ______________________________________________  RADIOLOGY I, Radar [employer] N Sherrod Toothman, personally viewed and evaluated these images (plain radiographs) as part of my medical decision making, as well as reviewing the written report by the radiologist.  ED MD interpretation: Periapical lucency/abscess of the right mandibular second bicuspid with sequelae of prior root canal likely source of infection.  Subtle 9 mm rim-enhancing collection adjacent to the right mandibular body consistent with a small odontogenic abscess associated inflammatory stranding and swelling within the adjacent right side of face consistent with regional cellulitis per radiologist on CT maxillofacial interpretation.  Official  radiology report(s): CT Maxillofacial W Contrast  Result Date: 09/21/2019 CLINICAL DATA:  Initial evaluation for acute right-sided facial swelling status post root canal. EXAM: CT MAXILLOFACIAL WITH CONTRAST TECHNIQUE: Multidetector CT imaging of the maxillofacial structures was performed with intravenous contrast. Multiplanar CT image reconstructions were also generated. CONTRAST:  75mL OMNIPAQUE IOHEXOL 300 MG/ML  SOLN COMPARISON:  None. FINDINGS: Osseous: No acute osseous abnormality about the face. No discrete osseous lesion. Orbits: Globes and orbital soft tissues within normal limits. Patient status post bilateral ocular lens replacement. Sinuses: Trace layering opacity noted within the right sphenoid sinus. Paranasal sinuses are otherwise clear. Mastoid air cells and middle ear cavities are well pneumatized and free of fluid. Soft tissues: Soft tissue swelling with inflammatory stranding seen involving the right face, primarily involving the right masticator space adjacent to the right jaw. Periapical lucency/abscess seen at the right mandibular second bicuspid with sequelae of prior root canal, likely source of infection. Subtle 9 mm rim enhancing collection adjacent to the right mandibular body suspicious for an odontogenic abscess (series 2, image 57), somewhat difficult to visualize given streak artifact from adjacent dental amalgam. No other discrete abscess or drainable fluid collection.  No extension of infection into the deeper spaces of the face or neck. Limited intracranial: Unremarkable. IMPRESSION: Periapical lucency/abscess at the right mandibular second bicuspid with sequelae of prior root canal, likely source of infection. Subtle 9 mm rim enhancing collection adjacent to the right mandibular body consistent with a small odontogenic abscess. Associated inflammatory stranding and swelling within the adjacent right face consistent with regional cellulitis. Electronically Signed   By: Jeannine Boga M.D.   On: 09/21/2019 01:49      Procedures   ____________________________________________   INITIAL IMPRESSION / MDM / ASSESSMENT AND PLAN / ED COURSE  As part of my medical decision making, I reviewed the following data within the electronic MEDICAL RECORD NUMBER   63 year old female presented with above-stated history and physical exam secondary to right facial swelling following root canal with associated fever at home tonight.  Concern for possible right facial cellulitis and odontogenic abscess.  As such CT maxillofacial performed which did reveal a small odontogenic abscess.  Patient was given 3 g of Unasyn in the emergency department.  Patient will be prescribed clindamycin for home with recommendation to follow-up with recommendation to follow-up with orthodontist today as planned.     ____________________________________________  FINAL CLINICAL IMPRESSION(S) / ED DIAGNOSES  Final diagnoses:  Dental abscess  Facial cellulitis     MEDICATIONS GIVEN DURING THIS VISIT:  Medications  Ampicillin-Sulbactam (UNASYN) 3 g in sodium chloride 0.9 % 100 mL IVPB (0 g Intravenous Stopped 09/21/19 0109)  iohexol (OMNIPAQUE) 300 MG/ML solution 75 mL (75 mLs Intravenous Contrast Given 09/21/19 0036)     ED Discharge Orders         Ordered    clindamycin (CLEOCIN) 300 MG capsule  3 times daily     09/21/19 0211          *Please note:  Raford Pitcher was evaluated in Emergency Department on 09/21/2019 for the symptoms described in the history of present illness. She was evaluated in the context of the global COVID-19 pandemic, which necessitated consideration that the patient might be at risk for infection with the SARS-CoV-2 virus that causes COVID-19. Institutional protocols and algorithms that pertain to the evaluation of patients at risk for COVID-19 are in a state of rapid change based on information released by regulatory bodies including the CDC and federal and  state organizations. These policies and algorithms were followed during the patient's care in the ED.  Some ED evaluations and interventions may be delayed as a result of limited staffing during the pandemic.*  Note:  This document was prepared using Dragon voice recognition software and may include unintentional dictation errors.   Gregor Hams, MD 09/21/19 [phone]

## 2019-09-21 ENCOUNTER — Emergency Department: Payer: BC Managed Care – PPO

## 2019-09-21 LAB — BASIC METABOLIC PANEL
Anion gap: 10 (ref 5–15)
BUN: 12 mg/dL (ref 8–23)
CO2: 25 mmol/L (ref 22–32)
Calcium: 9 mg/dL (ref 8.9–10.3)
Chloride: 103 mmol/L (ref 98–111)
Creatinine, Ser: 0.68 mg/dL (ref 0.44–1.00)
GFR calc Af Amer: >60 mL/min (ref >60)
GFR calc non Af Amer: >60 mL/min (ref >60)
Glucose, Bld: 117 mg/dL — ABNORMAL HIGH (ref 70–99)
Potassium: 3.8 mmol/L (ref 3.5–5.1)
Sodium: 138 mmol/L (ref 135–145)

## 2019-09-21 MED ORDER — IOHEXOL 300 MG/ML  SOLN
75.0000 mL | Freq: Once | INTRAMUSCULAR | Status: AC | PRN
  Administered 2019-09-21: 01:00:00 75 mL via INTRAVENOUS

## 2019-09-21 MED ORDER — CLINDAMYCIN HCL 300 MG PO CAPS: 300.0000 mg | ORAL_CAPSULE | Freq: Three times a day (TID) | ORAL | 0 refills | Status: AC

## 2019-11-25 ENCOUNTER — Emergency Department: Payer: BC Managed Care – PPO

## 2019-11-25 ENCOUNTER — Encounter: Payer: Self-pay | Admitting: Emergency Medicine

## 2019-11-25 ENCOUNTER — Other Ambulatory Visit: Payer: Self-pay

## 2019-11-25 ENCOUNTER — Emergency Department
Admission: EM | Admit: 2019-11-25 | Discharge: 2019-11-25 | Disposition: A | Discharge status: Home / Self Care | Payer: BC Managed Care – PPO | Attending: Emergency Medicine | Admitting: Emergency Medicine

## 2019-11-25 DIAGNOSIS — S6991XA Unspecified injury of right wrist, hand and finger(s), initial encounter: Secondary | ICD-10-CM | POA: Diagnosis present

## 2019-11-25 DIAGNOSIS — Y92099 Unspecified place in other non-institutional residence as the place of occurrence of the external cause: Secondary | ICD-10-CM | POA: Insufficient documentation

## 2019-11-25 DIAGNOSIS — Y9389 Activity, other specified: Secondary | ICD-10-CM | POA: Diagnosis not present

## 2019-11-25 DIAGNOSIS — W108XXA Fall (on) (from) other stairs and steps, initial encounter: Secondary | ICD-10-CM | POA: Insufficient documentation

## 2019-11-25 DIAGNOSIS — M79631 Pain in right forearm: Secondary | ICD-10-CM | POA: Insufficient documentation

## 2019-11-25 DIAGNOSIS — Y999 Unspecified external cause status: Secondary | ICD-10-CM | POA: Diagnosis not present

## 2019-11-25 DIAGNOSIS — S4991XA Unspecified injury of right shoulder and upper arm, initial encounter: Secondary | ICD-10-CM

## 2019-11-25 DIAGNOSIS — W19XXXA Unspecified fall, initial encounter: Secondary | ICD-10-CM

## 2019-11-25 MED ORDER — IBUPROFEN 600 MG PO TABS: 600.0000 mg | ORAL_TABLET | Freq: Four times a day (QID) | ORAL | 0 refills | Status: AC | PRN

## 2019-11-25 NOTE — ED Provider Notes (Signed)
[facility] Emergency Department Provider Note  ____________________________________________  Time seen: Approximately 2:17 PM  I have reviewed the triage vital signs and the nursing notes.   HISTORY  Chief Complaint Fall    HPI Molly Lawrence is a 64 y.o. female that presents to the emergency department for evaluation of right forearm pain following a fall today.  Patient tripped going up 2 stairs on her porch.  She states that the steps are like a "stump".  There are 2 steps going up and then 2 steps going back down.  She tripped going up to 2 steps and then fell down the other 2 steps.  She did not hit her head or lose consciousness.  She used her hands to brace her fall.  She landed on both of her knees.  She is not having any pain to her left arm or both knees.  She is having pain to her mid to distal forearm.  She is able to move her arm but with some minor pain.  No numbness, tingling.   Past Medical History:  Diagnosis Date  . Arthritis    hands, knees, feet  . Complication of anesthesia    low BP after hysterectomy  . Hypothyroidism     There are no problems to display for this patient.   Past Surgical History:  Procedure Laterality Date  . ABDOMINAL HYSTERECTOMY    . APPENDECTOMY    . COLONOSCOPY    . KNEE ARTHROSCOPY Right 10/26/2017   Procedure: Arthroscopic partial medial meniscectomy, arthroscopic abrasion chondroplasty with microfracturing of focal lateral femoral condylar lesion, arthroscopic abrasion chondroplasty of grade 2-3 chondromalacial changes of medial femoral condyle, lateral tibial plateau, and femoral trochlea, right knee.;  Surgeon: Christena Flake, MD;  Location: [facility];  Service: Orthopedics;  Later    Prior to Admission medications   Medication Sig Start Date End Date Taking? Authorizing Provider  amoxicillin (AMOXIL) 500 MG capsule Take 500 mg by mouth 3 (three) times daily. 09/19/19   [provider]  diphenhydrAMINE (BENADRYL) 25 MG tablet Take 25 mg by mouth at bedtime as needed.    [provider]  ibuprofen (ADVIL) 600 MG tablet Take 1 tablet (600 mg total) by mouth every 6 (six) hours as needed. 11/25/19   Enid Derry, PA-C  levothyroxine (SYNTHROID) 125 MCG tablet Take 125 mcg by mouth daily before breakfast.    [provider]  Multiple Vitamin (MULTIVITAMIN) tablet Take 1 tablet by mouth daily.    [provider]  omeprazole (PRILOSEC) 40 MG capsule Take 40 mg by mouth daily. 08/06/19   [provider]    Allergies Pineapple  Family History  Problem Relation Age of Onset  . Breast cancer Neg Hx     Social History Social History   Tobacco Use  . Smoking status: Former Smoker    Quit date: 1991    Years since quitting: 30.1  . Smokeless tobacco: Never Used  Substance Use Topics  . Alcohol use: Yes    Alcohol/week: 5.0 standard drinks    Types: 5 Glasses of wine per week  . Drug use: Never     Review of Systems  Constitutional: No fever/chills Respiratory: No SOB. Gastrointestinal: No nausea, no vomiting.  Musculoskeletal: Positive for forearm pain. Skin: Negative for rash, abrasions, lacerations, ecchymosis. Neurological: Negative for numbness or tingling   ____________________________________________   PHYSICAL EXAM:  VITAL SIGNS: ED Triage Vitals  Enc Vitals Group  BP 11/25/19 1342 128/64     Pulse Rate 11/25/19 1342 66     Resp 11/25/19 1342 16     Temp 11/25/19 1343 97.6 F (36.4 C)     Temp Source 11/25/19 1343 Oral     SpO2 11/25/19 1342 99 %     Weight 11/25/19 1341 200 lb (90.7 kg)     Height 11/25/19 1341 5\' 9"  (1.753 m)     Head Circumference --      Peak Flow --      Pain Score 11/25/19 1340 5     Pain Loc --      Pain Edu? --      Excl. in GC? --      Constitutional: Alert and oriented. Well appearing and in no acute distress. Eyes: Conjunctivae are normal. PERRL. EOMI. Head:  Atraumatic. ENT:      Ears:      Nose: No congestion/rhinnorhea.      Mouth/Throat: Mucous membranes are moist.  Neck: No stridor.  No cervical spine tenderness to palpation. Cardiovascular: Normal rate, regular rhythm.  Good peripheral circulation.  Symmetric radial pulses bilaterally. Respiratory: Normal respiratory effort without tachypnea or retractions. Lungs CTAB. Good air entry to the bases with no decreased or absent breath sounds. Gastrointestinal: Bowel sounds 4 quadrants. Soft and nontender to palpation. No guarding or rigidity. No palpable masses. No distention. Musculoskeletal: Full range of motion to all extremities. No gross deformities appreciated.  Mild tenderness to palpation to distal to mid forearm.  Full range of motion, including supination and pronation of wrist with minimal pain. Neurologic:  Normal speech and language. No gross focal neurologic deficits are appreciated.  Skin:  Skin is warm, dry and intact. No rash noted. Psychiatric: Mood and affect are normal. Speech and behavior are normal. Patient exhibits appropriate insight and judgement.   ____________________________________________   LABS (all labs ordered are listed, but only abnormal results are displayed)  Labs Reviewed - No data to display ____________________________________________  EKG   ____________________________________________  RADIOLOGY 11/27/19, personally viewed and evaluated these images (plain radiographs) as part of my medical decision making, as well as reviewing the written report by the radiologist.  DG Wrist Complete Right  Result Date: 11/25/2019 CLINICAL DATA:  Fall with wrist and forearm pain. EXAM: RIGHT WRIST - COMPLETE 3+ VIEW COMPARISON:  None. FINDINGS: There is no evidence of fracture or dislocation. There is no evidence of arthropathy or other focal bone abnormality. Soft tissues are unremarkable. IMPRESSION: Negative. Electronically Signed   By: 11/27/2019  M.D.   On: 11/25/2019 14:45    ____________________________________________    PROCEDURES  Procedure(s) performed:    Procedures    Medications - No data to display   ____________________________________________   INITIAL IMPRESSION / ASSESSMENT AND PLAN / ED COURSE  Pertinent labs & imaging results that were available during my care of the patient were reviewed by me and considered in my medical decision making (see chart for details).  Review of the [place] CSRS was performed in accordance of the [facility] prior to dispensing any controlled drugs.   Patient presented to emergency department for evaluation after fall.  Vital signs and exam are reassuring.  No acute bony abnormalities on x-ray.  Arm sling was placed.  Patient will be discharged home with prescriptions for ibuprofen. Patient is to follow up with PCP as directed. Patient is given ED precautions to return to the ED for any worsening or new symptoms.  Raford Pitcher was evaluated in Emergency Department on 11/25/2019 for the symptoms described in the history of present illness. She was evaluated in the context of the global COVID-19 pandemic, which necessitated consideration that the patient might be at risk for infection with the SARS-CoV-2 virus that causes COVID-19. Institutional protocols and algorithms that pertain to the evaluation of patients at risk for COVID-19 are in a state of rapid change based on information released by regulatory bodies including the CDC and federal and state organizations. These policies and algorithms were followed during the patient's care in the ED.  ____________________________________________  FINAL CLINICAL IMPRESSION(S) / ED DIAGNOSES  Final diagnoses:  Fall, initial encounter  Injury of right upper extremity, initial encounter      NEW MEDICATIONS STARTED DURING THIS VISIT:  ED Discharge Orders         Ordered    ibuprofen (ADVIL) 600 MG tablet  Every 6 hours PRN      11/25/19 1504              This chart was dictated using voice recognition software/Dragon. Despite best efforts to proofread, errors can occur which can change the meaning. Any change was purely unintentional.    Laban Emperor, PA-C 11/25/19 1518    Vanessa [place], MD 11/29/19 [phone]

## 2019-11-25 NOTE — ED Notes (Signed)
Pt states she fell going up stairs outside and broke fall with right arm. Pt c/o right forearm pain. Pt denies dizziness, LOC, or other injury.  Pt aox4, ambulatory, nad noted, no bleeding or obvious deformity noted. Sling applied to right arm, Ice pack provided.

## 2019-11-25 NOTE — ED Triage Notes (Signed)
Pt to ED via POV for fall. Pt is having right arm pain. Pt is in NAD.

## 2020-07-04 ENCOUNTER — Other Ambulatory Visit: Payer: Self-pay

## 2020-07-04 ENCOUNTER — Encounter: Payer: Self-pay | Admitting: Obstetrics and Gynecology

## 2020-07-04 ENCOUNTER — Ambulatory Visit (INDEPENDENT_AMBULATORY_CARE_PROVIDER_SITE_OTHER): Payer: BC Managed Care – PPO | Admitting: Obstetrics and Gynecology

## 2020-07-04 VITALS — BP 122/74 | Ht 69.0 in | Wt 197.0 lb

## 2020-07-04 DIAGNOSIS — Z1239 Encounter for other screening for malignant neoplasm of breast: Secondary | ICD-10-CM

## 2020-07-04 DIAGNOSIS — Z01419 Encounter for gynecological examination (general) (routine) without abnormal findings: Secondary | ICD-10-CM

## 2020-07-04 NOTE — Progress Notes (Signed)
Gynecology Annual Exam  PCP: Marguarite Arbour, MD  Chief Complaint:  Chief Complaint  Patient presents with  . Gynecologic Exam    History of Present Illness:Patient is a 64 y.o. G3P3 presents for annual exam. The patient has no complaints today.   LMP: No LMP recorded. Patient has had a hysterectomy.  The patient is sexually active. She denies dyspareunia.  The patient does perform self breast exams.  There is no notable family history of breast or ovarian cancer in her family.  The patient wears seatbelts: yes.   The patient has regular exercise: not asked.    The patient denies current symptoms of depression.     Review of Systems: Review of Systems  Constitutional: Negative for chills and fever.  HENT: Negative for congestion.   Respiratory: Negative for cough and shortness of breath.   Cardiovascular: Negative for chest pain and palpitations.  Gastrointestinal: Negative for abdominal pain, constipation, diarrhea, heartburn, nausea and vomiting.  Genitourinary: Negative for dysuria, frequency and urgency.  Skin: Negative for itching and rash.  Neurological: Negative for dizziness and headaches.  Endo/Heme/Allergies: Negative for polydipsia.  Psychiatric/Behavioral: Negative for depression.    Past Medical History:  There are no problems to display for this patient.   Past Surgical History:  Past Surgical History:  Procedure Laterality Date  . ABDOMINAL HYSTERECTOMY    . APPENDECTOMY    . COLONOSCOPY    . KNEE ARTHROSCOPY Right 10/26/2017   Procedure: Arthroscopic partial medial meniscectomy, arthroscopic abrasion chondroplasty with microfracturing of focal lateral femoral condylar lesion, arthroscopic abrasion chondroplasty of grade 2-3 chondromalacial changes of medial femoral condyle, lateral tibial plateau, and femoral trochlea, right knee.;  Surgeon: Christena Flake, MD;  Location: [facility];  Service: Orthopedics;  Later    Gynecologic History:    No LMP recorded. Patient has had a hysterectomy. Last Pap: Results were: N/A s/p prior hysterectomy Last mammogram: 9/14/202020 Results were: BI-RAD I  Obstetric History: G3P3  Family History:  Family History  Problem Relation Age of Onset  . Breast cancer Neg Hx     Social History:  Social History   Socioeconomic History  . Marital status: Married    Spouse name: Not on file  . Number of children: Not on file  . Years of education: Not on file  . Highest education level: Not on file  Occupational History  . Not on file  Tobacco Use  . Smoking status: Former Smoker    Quit date: 1991    Years since quitting: 30.7  . Smokeless tobacco: Never Used  Vaping Use  . Vaping Use: Never used  Substance and Sexual Activity  . Alcohol use: Yes    Alcohol/week: 5.0 standard drinks    Types: 5 Glasses of wine per week  . Drug use: Never  . Sexual activity: Yes    Birth control/protection: Surgical  Other Topics Concern  . Not on file  Social History Narrative  . Not on file   Social Determinants of Health   Financial Resource Strain:   . Difficulty of Paying Living Expenses: Not on file  Food Insecurity:   . Worried About Programme researcher, broadcasting/film/video in the Last Year: Not on file  . Ran Out of Food in the Last Year: Not on file  Transportation Needs:   . Lack of Transportation (Medical): Not on file  . Lack of Transportation (Non-Medical): Not on file  Physical Activity:   . Days of Exercise  per Week: Not on file  . Minutes of Exercise per Session: Not on file  Stress:   . Feeling of Stress : Not on file  Social Connections:   . Frequency of Communication with Friends and Family: Not on file  . Frequency of Social Gatherings with Friends and Family: Not on file  . Attends Religious Services: Not on file  . Active Member of Clubs or Organizations: Not on file  . Attends Banker Meetings: Not on file  . Marital Status: Not on file  Intimate Partner Violence:    . Fear of Current or Ex-Partner: Not on file  . Emotionally Abused: Not on file  . Physically Abused: Not on file  . Sexually Abused: Not on file    Allergies:  Allergies  Allergen Reactions  . Pineapple Swelling    tongue    Medications: Prior to Admission medications   Medication Sig Start Date End Date Taking? Authorizing Provider  ibuprofen (ADVIL) 600 MG tablet Take 1 tablet (600 mg total) by mouth every 6 (six) hours as needed. 11/25/19  Yes Enid Derry, PA-C  levothyroxine (SYNTHROID) 125 MCG tablet Take 125 mcg by mouth daily before breakfast.   Yes [provider]  omeprazole (PRILOSEC) 40 MG capsule Take 40 mg by mouth daily. 08/06/19  Yes [provider]    Physical Exam Vitals: Blood pressure 122/74, height 5\' 9"  (1.753 m), weight 197 lb (89.4 kg).  General: NAD HEENT: normocephalic, anicteric Thyroid: no enlargement, no palpable nodules Pulmonary: No increased work of breathing, CTAB Cardiovascular: RRR, distal pulses 2+ Breast: Breast symmetrical, no tenderness, no palpable nodules or masses, no skin or nipple retraction present, no nipple discharge.  No axillary or supraclavicular lymphadenopathy. Abdomen: NABS, soft, non-tender, non-distended.  Umbilicus without lesions.  No hepatomegaly, splenomegaly or masses palpable. No evidence of hernia  Genitourinary:  External: Normal external female genitalia.  Normal urethral meatus, normal Bartholin's and Skene's glands.    Vagina: Normal vaginal mucosa, no evidence of prolapse.    Cervix: absent  Uterus: absent  Adnexa: ovaries non-enlarged, no adnexal masses  Rectal: deferred  Lymphatic: no evidence of inguinal lymphadenopathy Extremities: no edema, erythema, or tenderness Neurologic: Grossly intact Psychiatric: mood appropriate, affect full  Female chaperone present for pelvic and breast  portions of the physical exam  Immunization History  Administered Date(s) Administered  . PFIZER  SARS-COV-2 Vaccination 11/16/2019, 12/07/2019     Assessment: 64 y.o. G3P3 routine annual exam  Plan: Problem List Items Addressed This Visit    None    Visit Diagnoses    Encounter for gynecological examination without abnormal finding    -  Primary   Breast screening       Relevant Orders   MM 3D SCREEN BREAST BILATERAL      1) Mammogram - recommend yearly screening mammogram.  Mammogram Was ordered today  2) STI screening  was notoffered and therefore not obtained  3) ASCCP guidelines and rational discussed.  Patient opts for discontinue secondary to prior hysterectomy screening interval  4) Osteoporosis  - per USPTF routine screening DEXA at age 48  5) Routine healthcare maintenance including cholesterol, diabetes screening discussed managed by PCP  6) Colonoscopy - next at age 40  7) Return in about 1 year (around 07/04/2021) for annual.    09/03/2021, MD Vena Austria, [facility] 07/04/2020, 10:29 AM

## 2020-07-04 NOTE — Patient Instructions (Signed)
[facility] [address] [place] [address]  [facility] [place]  [address] [place] [address]  Phone: [phone]  

## 2020-08-11 ENCOUNTER — Ambulatory Visit
Admission: RE | Admit: 2020-08-11 | Discharge: 2020-08-11 | Disposition: A | Discharge status: Home / Self Care | Payer: BC Managed Care – PPO | Source: Ambulatory Visit | Attending: Obstetrics and Gynecology | Admitting: Obstetrics and Gynecology

## 2020-08-11 ENCOUNTER — Other Ambulatory Visit: Payer: Self-pay

## 2020-08-11 DIAGNOSIS — Z1231 Encounter for screening mammogram for malignant neoplasm of breast: Secondary | ICD-10-CM | POA: Diagnosis not present

## 2020-08-11 DIAGNOSIS — Z1239 Encounter for other screening for malignant neoplasm of breast: Secondary | ICD-10-CM

## 2020-12-21 IMAGING — MG DIGITAL SCREENING BILAT W/ TOMO W/ CAD
6 of 10 series · 6 of 30 positions shown · non-contrast
Comparison: Previous exam(s).

CLINICAL DATA: Screening.

EXAM:
DIGITAL SCREENING BILATERAL MAMMOGRAM WITH TOMO AND CAD

[L CC synth-2D (1 of 2)]
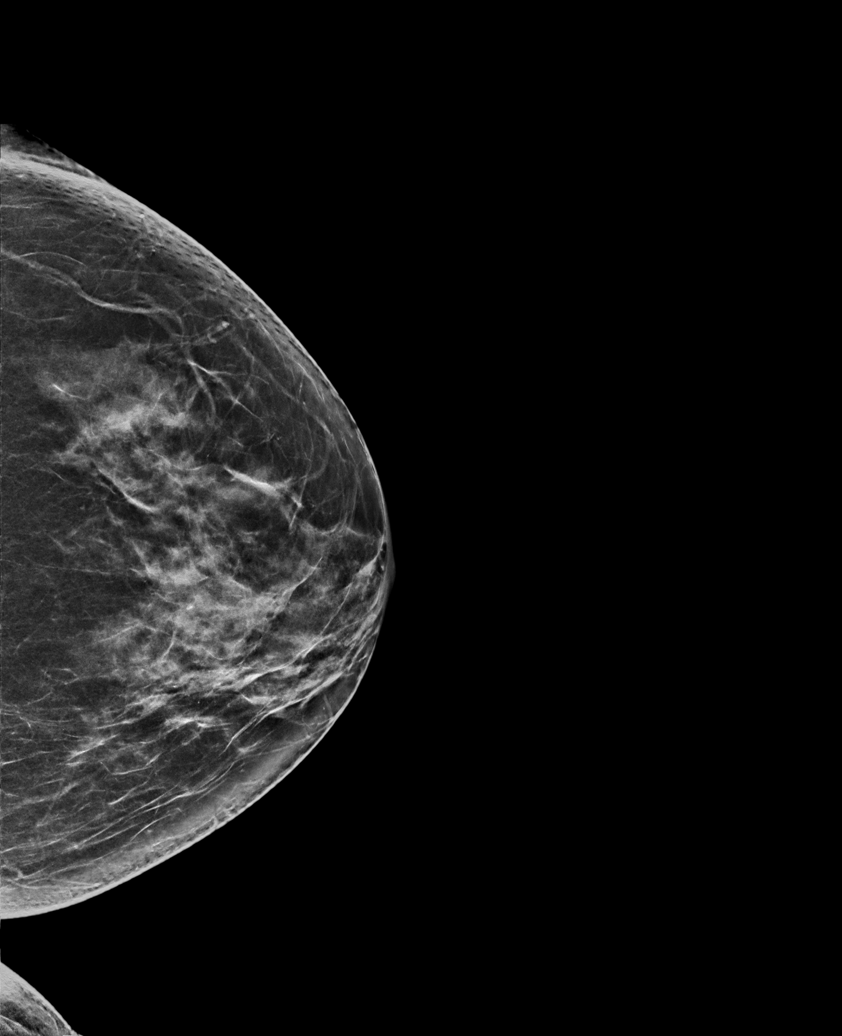

[R MLO synth-2D]
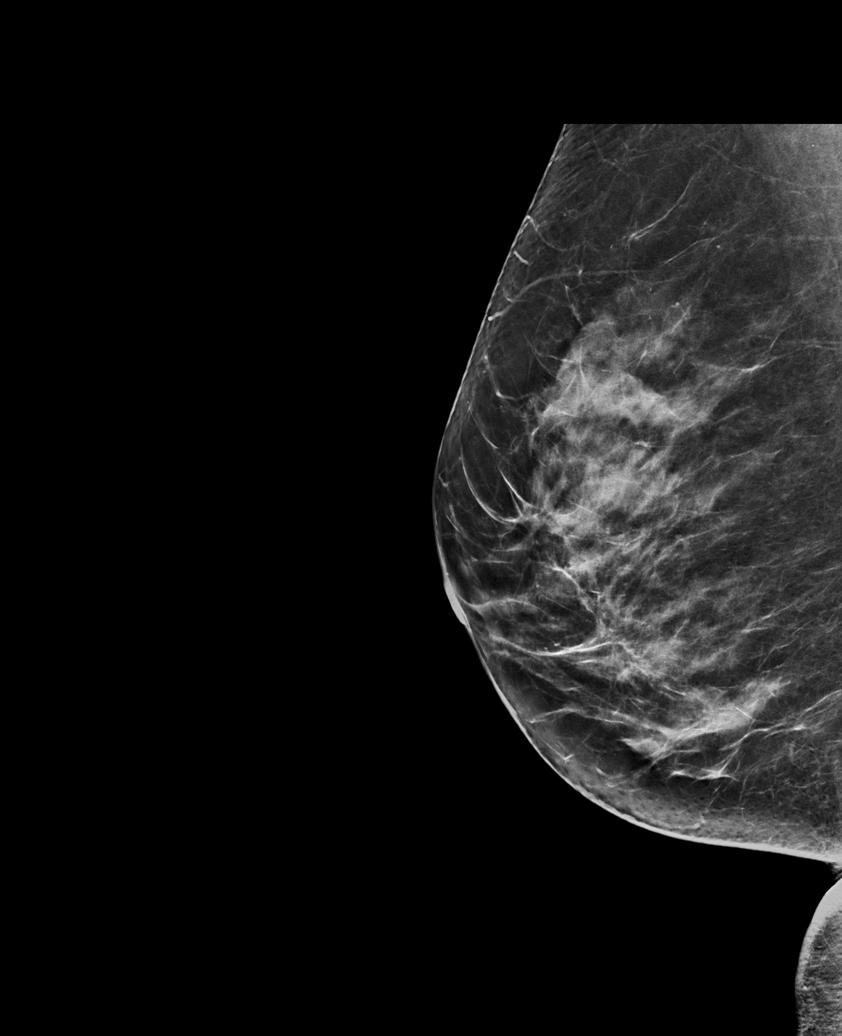

[R CC synth-2D]
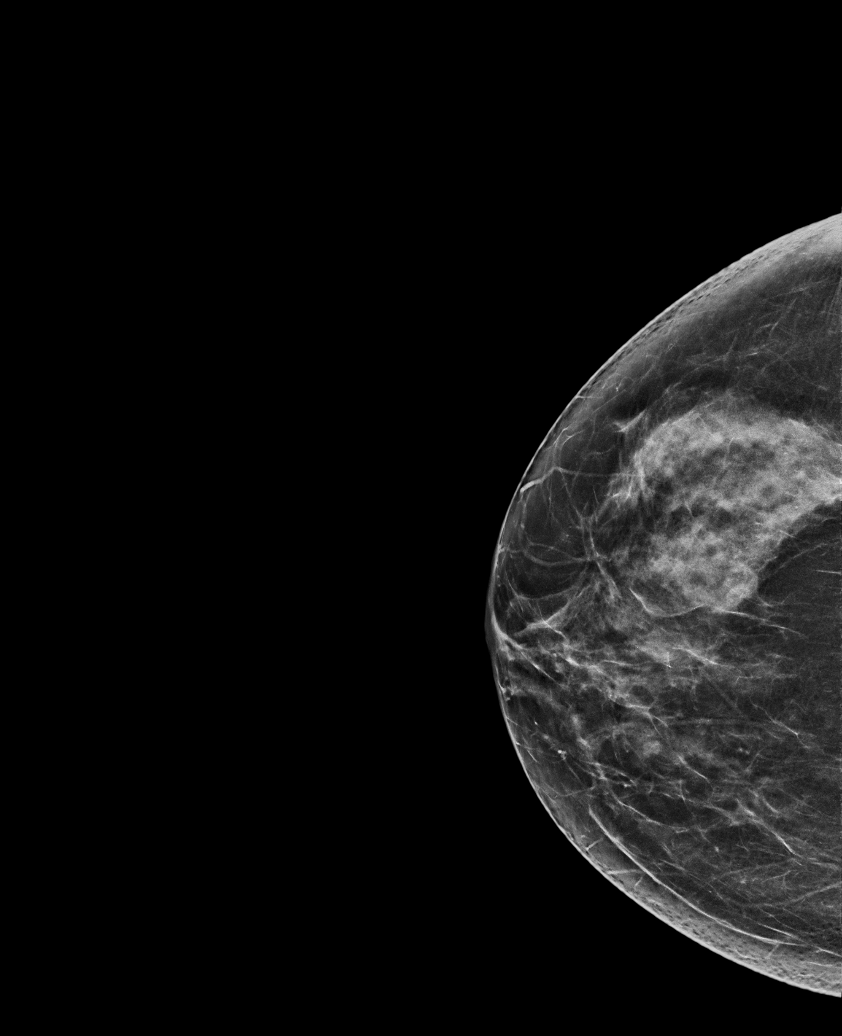

[L CC synth-2D (2 of 2)]
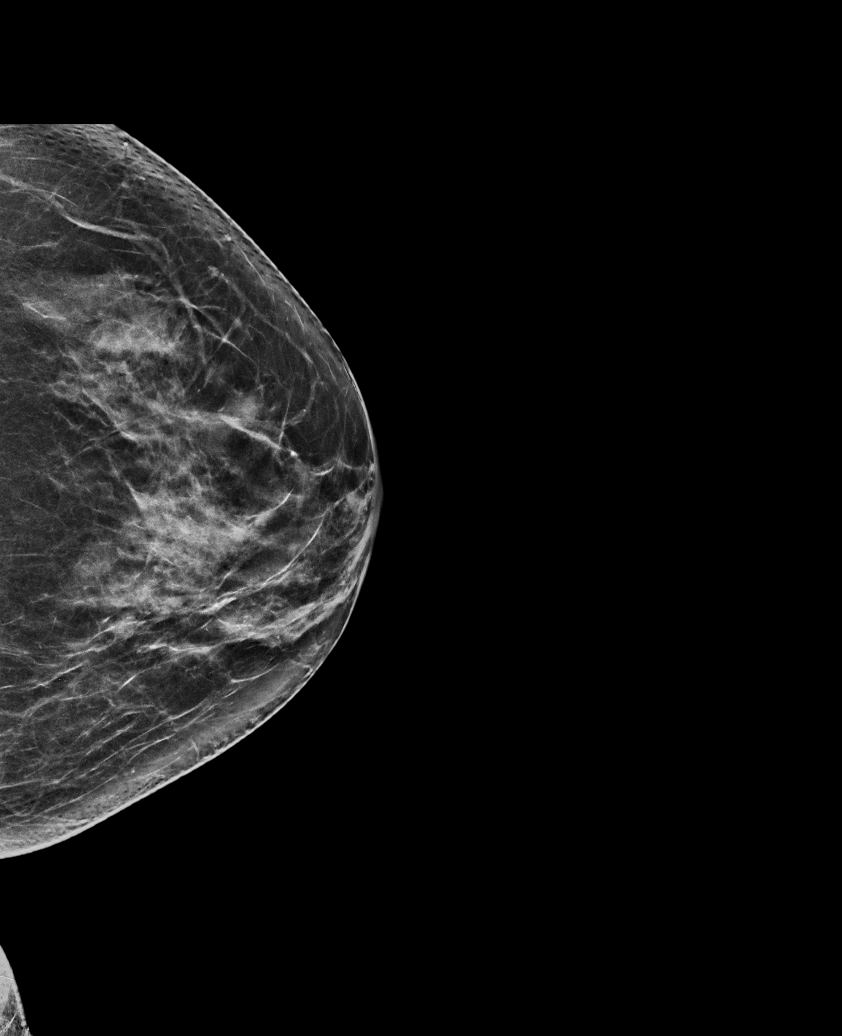

[L MLO synth-2D]
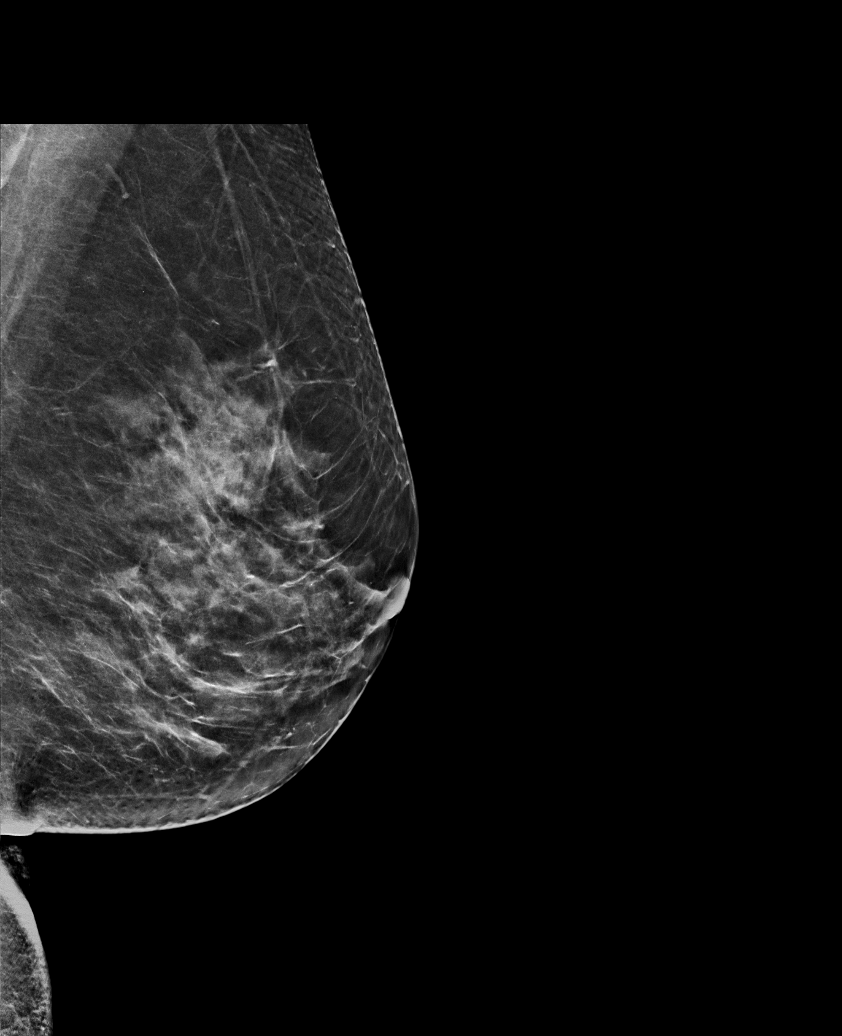

[L CC tomo · tomo slice 35/70.0]
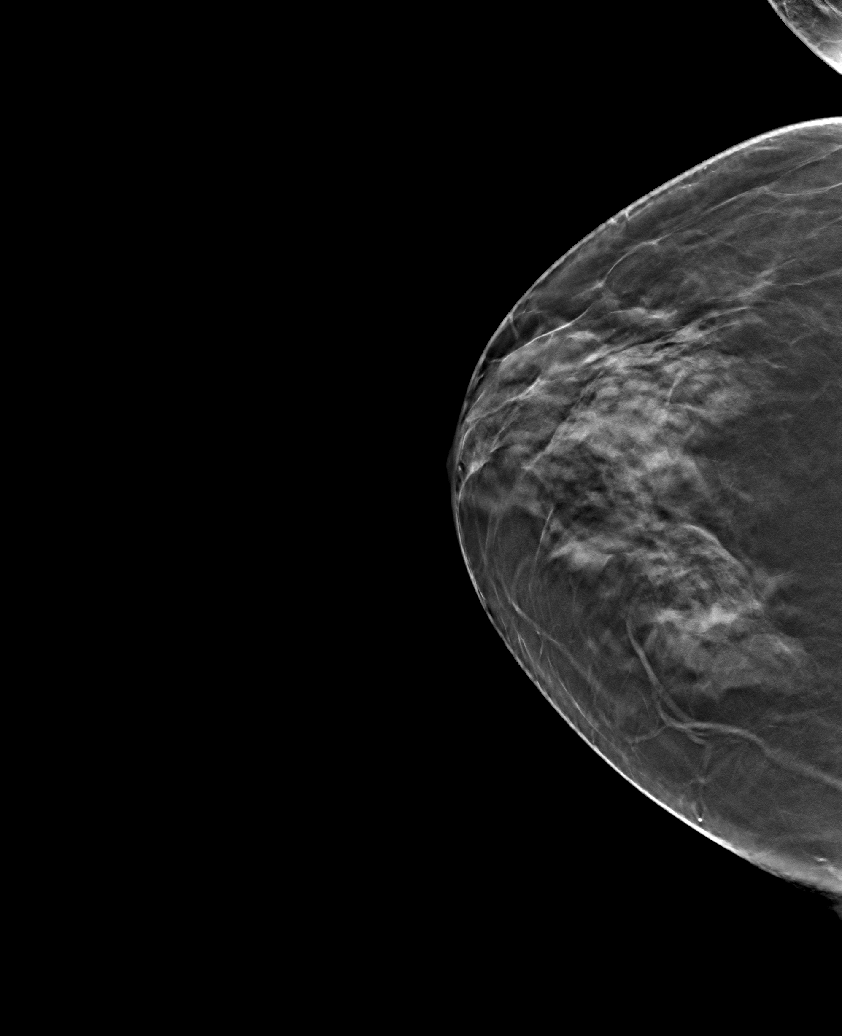

[6 of 30 positions shown; findings below may reference images not displayed]

ACR Breast Density Category c: The breast tissue is heterogeneously
dense, which may obscure small masses.
FINDINGS: There are no findings suspicious for malignancy. Images were
processed with CAD.
IMPRESSION: No mammographic evidence of malignancy. A result letter of this
screening mammogram will be mailed directly to the patient.

RECOMMENDATION:
Screening mammogram in one year. (Code:[id number]-U-LHB)

BI-RADS CATEGORY  1: Negative.

## 2021-07-10 ENCOUNTER — Ambulatory Visit: Payer: BC Managed Care – PPO | Admitting: Obstetrics and Gynecology

## 2021-07-20 ENCOUNTER — Other Ambulatory Visit: Payer: Self-pay | Admitting: Internal Medicine

## 2021-07-20 DIAGNOSIS — Z1231 Encounter for screening mammogram for malignant neoplasm of breast: Secondary | ICD-10-CM

## 2021-07-23 ENCOUNTER — Ambulatory Visit (INDEPENDENT_AMBULATORY_CARE_PROVIDER_SITE_OTHER): Payer: BC Managed Care – PPO | Admitting: Obstetrics and Gynecology

## 2021-07-23 ENCOUNTER — Other Ambulatory Visit: Payer: Self-pay

## 2021-07-23 ENCOUNTER — Encounter: Payer: Self-pay | Admitting: Obstetrics and Gynecology

## 2021-07-23 VITALS — BP 118/62 | HR 68 | Ht 69.0 in | Wt 197.0 lb

## 2021-07-23 DIAGNOSIS — Z1239 Encounter for other screening for malignant neoplasm of breast: Secondary | ICD-10-CM | POA: Diagnosis not present

## 2021-07-23 DIAGNOSIS — Z01419 Encounter for gynecological examination (general) (routine) without abnormal findings: Secondary | ICD-10-CM

## 2021-07-23 NOTE — Patient Instructions (Signed)
[facility] [address] [place] [address]  [facility] [place]  [address] [place] [address]  Phone: [phone]  

## 2021-07-23 NOTE — Progress Notes (Signed)
Gynecology Annual Exam  PCP: Marguarite Arbour, MD  Chief Complaint:  Chief Complaint  Patient presents with   Gynecologic Exam    Annual - no concerns. RM 5    History of Present Illness:Patient is a 65 y.o. G3P3 presents for annual exam. The patient has no complaints today.   LMP: No LMP recorded. Patient has had a hysterectomy.  The patient is sexually active. She denies dyspareunia.  The patient does perform self breast exams.  There is no notable family history of breast or ovarian cancer in her family.  The patient wears seatbelts: yes.   The patient has regular exercise: not asked.    The patient denies current symptoms of depression.     Review of Systems: Review of Systems  Constitutional:  Negative for chills and fever.  HENT:  Negative for congestion.   Respiratory:  Negative for cough and shortness of breath.   Cardiovascular:  Negative for chest pain and palpitations.  Gastrointestinal:  Negative for abdominal pain, constipation, diarrhea, heartburn, nausea and vomiting.  Genitourinary:  Negative for dysuria, frequency and urgency.  Skin:  Negative for itching and rash.  Neurological:  Negative for dizziness and headaches.  Endo/Heme/Allergies:  Negative for polydipsia.  Psychiatric/Behavioral:  Negative for depression.    Past Medical History:  There are no problems to display for this patient.   Past Surgical History:  Past Surgical History:  Procedure Laterality Date   ABDOMINAL HYSTERECTOMY     APPENDECTOMY     COLONOSCOPY     KNEE ARTHROSCOPY Right 10/26/2017   Procedure: Arthroscopic partial medial meniscectomy, arthroscopic abrasion chondroplasty with microfracturing of focal lateral femoral condylar lesion, arthroscopic abrasion chondroplasty of grade 2-3 chondromalacial changes of medial femoral condyle, lateral tibial plateau, and femoral trochlea, right knee.;  Surgeon: Christena Flake, MD;  Location: [facility];  Service: Orthopedics;   Later    Gynecologic History:  No LMP recorded. Patient has had a hysterectomy. Last Pap: Results were: N/A prior hysterecotmy  Last mammogram: 08/11/2020 Results were: BI-RAD I  Obstetric History: G3P3  Family History:  Family History  Problem Relation Age of Onset   Heart failure Mother    Heart failure Father    Breast cancer Neg Hx     Social History:  Social History   Socioeconomic History   Marital status: Married    Spouse name: Not on file   Number of children: Not on file   Years of education: Not on file   Highest education level: Not on file  Occupational History   Not on file  Tobacco Use   Smoking status: Former    Types: Cigarettes    Quit date: 1991    Years since quitting: 31.8   Smokeless tobacco: Never  Vaping Use   Vaping Use: Never used  Substance and Sexual Activity   Alcohol use: Yes    Alcohol/week: 5.0 standard drinks    Types: 5 Glasses of wine per week   Drug use: Never   Sexual activity: Yes    Birth control/protection: Surgical  Other Topics Concern   Not on file  Social History Narrative   Not on file   Social Determinants of Health   Financial Resource Strain: Not on file  Food Insecurity: Not on file  Transportation Needs: Not on file  Physical Activity: Not on file  Stress: Not on file  Social Connections: Not on file  Intimate Partner Violence: Not on file  Allergies:  Allergies  Allergen Reactions   Pineapple Swelling    tongue    Medications: Prior to Admission medications   Medication Sig Start Date End Date Taking? Authorizing Provider  levothyroxine (SYNTHROID) 125 MCG tablet Take 125 mcg by mouth daily before breakfast.   Yes [provider]  ibuprofen (ADVIL) 600 MG tablet Take 1 tablet (600 mg total) by mouth every 6 (six) hours as needed. 11/25/19   Enid Derry, PA-C  omeprazole (PRILOSEC) 40 MG capsule Take 40 mg by mouth daily. Patient not taking: Reported on 07/23/2021 08/06/19   [provider]    Physical Exam Vitals: Blood pressure 118/62, pulse 68, height 5\' 9"  (1.753 m), weight 197 lb (89.4 kg).  General: NAD HEENT: normocephalic, anicteric Thyroid: no enlargement, no palpable nodules Pulmonary: No increased work of breathing, CTAB Cardiovascular: RRR, distal pulses 2+ Breast: Breast symmetrical, no tenderness, no palpable nodules or masses, no skin or nipple retraction present, no nipple discharge.  No axillary or supraclavicular lymphadenopathy. Abdomen: NABS, soft, non-tender, non-distended.  Umbilicus without lesions.  No hepatomegaly, splenomegaly or masses palpable. No evidence of hernia  Genitourinary:  External: Normal external female genitalia.  Normal urethral meatus, normal Bartholin's and Skene's glands.    Vagina: surgically absent  Cervix:  surgically absent  Uterus: Non-enlarged, mobile, normal contour.  No CMT  Adnexa: ovaries non-enlarged, no adnexal masses  Rectal: deferred  Lymphatic: no evidence of inguinal lymphadenopathy Extremities: no edema, erythema, or tenderness Neurologic: Grossly intact Psychiatric: mood appropriate, affect full  Female chaperone present for pelvic and breast  portions of the physical exam     Assessment: 65 y.o. G3P3 routine annual exam  Plan: Problem List Items Addressed This Visit   None Visit Diagnoses     Encounter for gynecological examination without abnormal finding    -  Primary   Breast screening           1) Mammogram - recommend yearly screening mammogram.  Mammogram Was ordered today - scheduled for 08/12/2021  2) STI screening  was notoffered and therefore not obtained  3) ASCCP guidelines and rational discussed.  Patient opts for discontinue secondary to prior hysterectomy screening interval  4) Osteoporosis  - per USPTF routine screening DEXA at age 12  5) Routine healthcare maintenance including cholesterol, diabetes screening discussed managed by PCP  6) Colonoscopy UTD  due next year  7) Return in about 1 year (around 07/23/2022) for annual.    07/25/2022, MD Vena Austria, [facility] 07/23/2021, 3:45 PM

## 2021-08-12 ENCOUNTER — Other Ambulatory Visit: Payer: Self-pay

## 2021-08-12 ENCOUNTER — Ambulatory Visit
Admission: RE | Admit: 2021-08-12 | Discharge: 2021-08-12 | Disposition: A | Discharge status: Home / Self Care | Payer: BC Managed Care – PPO | Source: Ambulatory Visit | Attending: Internal Medicine | Admitting: Internal Medicine

## 2021-08-12 DIAGNOSIS — Z1231 Encounter for screening mammogram for malignant neoplasm of breast: Secondary | ICD-10-CM | POA: Insufficient documentation

## 2022-07-23 ENCOUNTER — Other Ambulatory Visit: Payer: Self-pay | Admitting: Internal Medicine

## 2022-07-23 DIAGNOSIS — Z1231 Encounter for screening mammogram for malignant neoplasm of breast: Secondary | ICD-10-CM

## 2022-08-25 ENCOUNTER — Ambulatory Visit
Admission: RE | Admit: 2022-08-25 | Discharge: 2022-08-25 | Disposition: A | Discharge status: Home / Self Care | Payer: BC Managed Care – PPO | Source: Ambulatory Visit | Attending: Internal Medicine | Admitting: Internal Medicine

## 2022-08-25 DIAGNOSIS — Z1231 Encounter for screening mammogram for malignant neoplasm of breast: Secondary | ICD-10-CM | POA: Diagnosis present

## 2022-11-16 DIAGNOSIS — Z83719 Family history of colon polyps, unspecified: Secondary | ICD-10-CM | POA: Diagnosis not present

## 2022-11-16 DIAGNOSIS — Z1211 Encounter for screening for malignant neoplasm of colon: Secondary | ICD-10-CM | POA: Diagnosis present

## 2022-11-16 DIAGNOSIS — K640 First degree hemorrhoids: Secondary | ICD-10-CM | POA: Diagnosis not present

## 2023-05-11 DIAGNOSIS — M19071 Primary osteoarthritis, right ankle and foot: Secondary | ICD-10-CM | POA: Diagnosis not present

## 2023-08-12 ENCOUNTER — Other Ambulatory Visit: Payer: Self-pay | Admitting: Internal Medicine

## 2023-08-12 DIAGNOSIS — Z1231 Encounter for screening mammogram for malignant neoplasm of breast: Secondary | ICD-10-CM

## 2023-08-19 DIAGNOSIS — E7849 Other hyperlipidemia: Secondary | ICD-10-CM | POA: Diagnosis not present

## 2023-08-19 DIAGNOSIS — R739 Hyperglycemia, unspecified: Secondary | ICD-10-CM | POA: Diagnosis not present

## 2023-08-19 DIAGNOSIS — E039 Hypothyroidism, unspecified: Secondary | ICD-10-CM | POA: Diagnosis not present

## 2023-08-19 DIAGNOSIS — E538 Deficiency of other specified B group vitamins: Secondary | ICD-10-CM | POA: Diagnosis not present

## 2023-08-19 DIAGNOSIS — Z79899 Other long term (current) drug therapy: Secondary | ICD-10-CM | POA: Diagnosis not present

## 2023-08-26 DIAGNOSIS — E785 Hyperlipidemia, unspecified: Secondary | ICD-10-CM | POA: Diagnosis not present

## 2023-08-26 DIAGNOSIS — M2550 Pain in unspecified joint: Secondary | ICD-10-CM | POA: Diagnosis not present

## 2023-08-26 DIAGNOSIS — E538 Deficiency of other specified B group vitamins: Secondary | ICD-10-CM | POA: Diagnosis not present

## 2023-08-26 DIAGNOSIS — Z79899 Other long term (current) drug therapy: Secondary | ICD-10-CM | POA: Diagnosis not present

## 2023-08-26 DIAGNOSIS — Z0000 Encounter for general adult medical examination without abnormal findings: Secondary | ICD-10-CM | POA: Diagnosis not present

## 2023-08-26 DIAGNOSIS — E079 Disorder of thyroid, unspecified: Secondary | ICD-10-CM | POA: Diagnosis not present

## 2023-08-26 DIAGNOSIS — R739 Hyperglycemia, unspecified: Secondary | ICD-10-CM | POA: Diagnosis not present

## 2023-08-26 DIAGNOSIS — M791 Myalgia, unspecified site: Secondary | ICD-10-CM | POA: Diagnosis not present

## 2023-08-30 ENCOUNTER — Ambulatory Visit
Admission: RE | Admit: 2023-08-30 | Discharge: 2023-08-30 | Disposition: A | Discharge status: Home / Self Care | Payer: Medicare HMO | Source: Ambulatory Visit | Attending: Internal Medicine | Admitting: Internal Medicine

## 2023-08-30 DIAGNOSIS — Z1231 Encounter for screening mammogram for malignant neoplasm of breast: Secondary | ICD-10-CM | POA: Diagnosis not present

## 2023-12-12 ENCOUNTER — Other Ambulatory Visit: Payer: Self-pay

## 2023-12-12 ENCOUNTER — Encounter: Payer: Self-pay | Admitting: Emergency Medicine

## 2023-12-12 ENCOUNTER — Ambulatory Visit: Admission: EM | Admit: 2023-12-12 | Discharge: 2023-12-12 | Disposition: A | Discharge status: Home / Self Care

## 2023-12-12 DIAGNOSIS — S61237A Puncture wound without foreign body of left little finger without damage to nail, initial encounter: Secondary | ICD-10-CM | POA: Diagnosis not present

## 2023-12-12 MED ORDER — DOXYCYCLINE HYCLATE 100 MG PO CAPS: 100.0000 mg | ORAL_CAPSULE | Freq: Two times a day (BID) | ORAL | 0 refills | Status: AC

## 2023-12-12 NOTE — ED Triage Notes (Addendum)
 Patient presents to [facility] for evaluation of left pinky pain x 1 week.  Had a splinter, removed it, had swelling coming and going, but this morning it is more swollen and painful.  Red.  Patient is currently on Cefdinir for a sinus infection

## 2023-12-12 NOTE — ED Provider Notes (Signed)
 Renaldo Fiddler    CSN: [id number] Arrival date & time: 12/12/23  0805      History   Chief Complaint Chief Complaint  Patient presents with   Hand Pain    HPI Molly Lawrence is a 68 y.o. female.   Patient presents for evaluation of pain, swelling and redness present to the left fifth finger beginning 7 days ago after removal of the splinter.  After removal cleanse area and applied Neosporin.  Has additionally attempted use of ice.  Able to complete range of motion but now experiencing a tightness.  Denies numbness, tingling or injury.  Past Medical History:  Diagnosis Date   Arthritis    hands, knees, feet   Complication of anesthesia    low BP after hysterectomy   Hypothyroidism     There are no active problems to display for this patient.   Past Surgical History:  Procedure Laterality Date   ABDOMINAL HYSTERECTOMY     APPENDECTOMY     COLONOSCOPY     KNEE ARTHROSCOPY Right 10/26/2017   Procedure: Arthroscopic partial medial meniscectomy, arthroscopic abrasion chondroplasty with microfracturing of focal lateral femoral condylar lesion, arthroscopic abrasion chondroplasty of grade 2-3 chondromalacial changes of medial femoral condyle, lateral tibial plateau, and femoral trochlea, right knee.;  Surgeon: Christena Flake, MD;  Location: [facility];  Service: Orthopedics;  Later    OB History     Gravida  3   Para  3   Term      Preterm      AB      Living  3      SAB      IAB      Ectopic      Multiple      Live Births               Home Medications    Prior to Admission medications   Medication Sig Start Date End Date Taking? Authorizing Provider  cefdinir (OMNICEF) 300 MG capsule Take 300 mg by mouth 2 (two) times daily.   Yes [provider]  ibuprofen (ADVIL) 600 MG tablet Take 1 tablet (600 mg total) by mouth every 6 (six) hours as needed. 11/25/19   Enid Derry, PA-C  levothyroxine (SYNTHROID) 125 MCG tablet  Take 125 mcg by mouth daily before breakfast.    [provider]  omeprazole (PRILOSEC) 40 MG capsule Take 40 mg by mouth daily. Patient not taking: Reported on 07/23/2021 08/06/19   [provider]    Family History Family History  Problem Relation Age of Onset   Heart failure Mother    Heart failure Father    Breast cancer Neg Hx     Social History Social History   Tobacco Use   Smoking status: Former    Current packs/day: 0.00    Types: Cigarettes    Quit date: 1991    Years since quitting: 34.2   Smokeless tobacco: Never  Vaping Use   Vaping status: Never Used  Substance Use Topics   Alcohol use: Yes    Alcohol/week: 5.0 standard drinks of alcohol    Types: 5 Glasses of wine per week   Drug use: Never     Allergies   Pineapple   Review of Systems Review of Systems   Physical Exam Triage Vital Signs ED Triage Vitals [12/12/23 0817]  Encounter Vitals Group     BP 114/69     Systolic BP Percentile  Diastolic BP Percentile      Pulse Rate (!) 56     Resp 16     Temp 97.9 F (36.6 C)     Temp Source Temporal     SpO2 96 %     Weight      Height      Head Circumference      Peak Flow      Pain Score 5     Pain Loc      Pain Education      Exclude from Growth Chart    No data found.  Updated Vital Signs BP 114/69   Pulse (!) 56   Temp 97.9 F (36.6 C) (Temporal)   Resp 16   SpO2 96%   Visual Acuity Right Eye Distance:   Left Eye Distance:   Bilateral Distance:    Right Eye Near:   Left Eye Near:    Bilateral Near:     Physical Exam Constitutional:      Appearance: Normal appearance.  Eyes:     Extraocular Movements: Extraocular movements intact.  Pulmonary:     Effort: Pulmonary effort is normal.  Skin:    Comments: Less than 0.5 cm puncture present to the lateral aspect of the left fifth finger with surrounding erythema, mild swelling and tenderness present to the proximal phalanx of the left fifth finger,  able to complete range of motion, sensation intact, capillary refill less than 3  Neurological:     Mental Status: She is alert and oriented to person, place, and time. Mental status is at baseline.      UC Treatments / Results  Labs (all labs ordered are listed, but only abnormal results are displayed) Labs Reviewed - No data to display  EKG   Radiology No results found.  Procedures Procedures (including critical care time)  Medications Ordered in UC Medications - No data to display  Initial Impression / Assessment and Plan / UC Course  I have reviewed the triage vital signs and the nursing notes.  Pertinent labs & imaging results that were available during my care of the patient were reviewed by me and considered in my medical decision making (see chart for details).  Puncture wound of the left little finger  Presentation is consistent with infection, discussed with patient, currently taking cefdinir for sinus infection but has seen no improvement with her symptoms therefore prescribed doxycycline for MRSA coverage, recommended supportive care as well as daily cleansing, advise follow-up for persisting or worsening symptoms Final Clinical Impressions(s) / UC Diagnoses   Final diagnoses:  None   Discharge Instructions   None    ED Prescriptions   None    PDMP not reviewed this encounter.   Valinda Hoar, [place] 12/12/23 [phone]

## 2023-12-12 NOTE — Discharge Instructions (Addendum)
 Your evaluated for the wound to your finger which is consistent with infection  Begin doxycycline every morning and every evening for 7 days  Intentionally cleanse over the wound at least once daily with soap and water, pat dry, may leave open to air  For any pain May use Tylenol and or Motrin as needed  May apply ice or heat over the affected area  Please follow-up with urgent care or your primary doctor if symptoms continue to persist

## 2024-08-29 ENCOUNTER — Other Ambulatory Visit: Payer: Self-pay | Admitting: Internal Medicine

## 2024-08-29 DIAGNOSIS — Z1231 Encounter for screening mammogram for malignant neoplasm of breast: Secondary | ICD-10-CM

## 2024-08-31 ENCOUNTER — Other Ambulatory Visit: Payer: Self-pay | Admitting: Internal Medicine

## 2024-08-31 DIAGNOSIS — E7849 Other hyperlipidemia: Secondary | ICD-10-CM

## 2024-08-31 DIAGNOSIS — Z0000 Encounter for general adult medical examination without abnormal findings: Secondary | ICD-10-CM

## 2024-09-10 ENCOUNTER — Encounter

## 2024-09-11 ENCOUNTER — Inpatient Hospital Stay
Admission: RE | Admit: 2024-09-11 | Discharge: 2024-09-11 | Payer: Self-pay | Attending: Internal Medicine | Admitting: Internal Medicine

## 2024-09-11 DIAGNOSIS — Z0000 Encounter for general adult medical examination without abnormal findings: Secondary | ICD-10-CM

## 2024-09-11 DIAGNOSIS — E7849 Other hyperlipidemia: Secondary | ICD-10-CM

## 2024-10-02 ENCOUNTER — Encounter

## 2024-10-08 ENCOUNTER — Ambulatory Visit
Admission: RE | Admit: 2024-10-08 | Discharge: 2024-10-08 | Disposition: A | Discharge status: Home / Self Care | Source: Ambulatory Visit | Attending: Internal Medicine | Admitting: Internal Medicine

## 2024-10-08 DIAGNOSIS — Z1231 Encounter for screening mammogram for malignant neoplasm of breast: Secondary | ICD-10-CM | POA: Insufficient documentation
